# Patient Record
Sex: Female | Born: 1975 | Race: White | Hispanic: No | Marital: Married | Smoking: Never smoker
Health system: Southern US, Community
[De-identification: ages and names within clinical notes are randomized; demographics above are authoritative.]

## PROBLEM LIST (undated history)

## (undated) DIAGNOSIS — K219 Gastro-esophageal reflux disease without esophagitis: Secondary | ICD-10-CM

## (undated) DIAGNOSIS — J45909 Unspecified asthma, uncomplicated: Secondary | ICD-10-CM

## (undated) DIAGNOSIS — T7840XA Allergy, unspecified, initial encounter: Secondary | ICD-10-CM

## (undated) DIAGNOSIS — G43909 Migraine, unspecified, not intractable, without status migrainosus: Secondary | ICD-10-CM

## (undated) HISTORY — DX: Migraine, unspecified, not intractable, without status migrainosus: G43.909

## (undated) HISTORY — DX: Unspecified asthma, uncomplicated: J45.909

## (undated) HISTORY — DX: Gastro-esophageal reflux disease without esophagitis: K21.9

## (undated) HISTORY — DX: Allergy, unspecified, initial encounter: T78.40XA

---

## 2012-03-03 ENCOUNTER — Ambulatory Visit: Payer: Self-pay | Admitting: Internal Medicine

## 2012-03-19 ENCOUNTER — Ambulatory Visit: Payer: Self-pay | Admitting: Internal Medicine

## 2012-04-01 ENCOUNTER — Emergency Department: Payer: Self-pay | Admitting: Unknown Physician Specialty

## 2012-04-01 LAB — COMPREHENSIVE METABOLIC PANEL
Alkaline Phosphatase: 82 U/L (ref 50–136)
Anion Gap: 8 (ref 7–16)
Bilirubin,Total: 0.3 mg/dL (ref 0.2–1.0)
Chloride: 105 mmol/L (ref 98–107)
Co2: 25 mmol/L (ref 21–32)
Creatinine: 1.12 mg/dL (ref 0.60–1.30)
EGFR (African American): >60
EGFR (Non-African Amer.): >60
SGPT (ALT): 44 U/L
Sodium: 138 mmol/L (ref 136–145)

## 2012-04-01 LAB — CBC
HGB: 14.1 g/dL (ref 12.0–16.0)
MCH: 29.9 pg (ref 26.0–34.0)
MCV: 89 fL (ref 80–100)
Platelet: 218 10*3/uL (ref 150–440)
RBC: 4.72 10*6/uL (ref 3.80–5.20)
WBC: 10.4 10*3/uL (ref 3.6–11.0)

## 2012-04-01 LAB — URINALYSIS, COMPLETE
Glucose,UR: NEGATIVE mg/dL (ref 0–75)
Ketone: NEGATIVE
Ph: 8 (ref 4.5–8.0)
Protein: NEGATIVE
Specific Gravity: 1.006 (ref 1.003–1.030)
Squamous Epithelial: 1

## 2012-05-06 DIAGNOSIS — G44209 Tension-type headache, unspecified, not intractable: Secondary | ICD-10-CM | POA: Insufficient documentation

## 2013-02-13 ENCOUNTER — Ambulatory Visit: Payer: Self-pay | Admitting: Internal Medicine

## 2015-01-13 DIAGNOSIS — G43809 Other migraine, not intractable, without status migrainosus: Secondary | ICD-10-CM | POA: Insufficient documentation

## 2015-02-16 ENCOUNTER — Encounter: Payer: Self-pay | Admitting: General Surgery

## 2015-02-16 ENCOUNTER — Ambulatory Visit (INDEPENDENT_AMBULATORY_CARE_PROVIDER_SITE_OTHER): Payer: 59 | Admitting: General Surgery

## 2015-02-16 VITALS — BP 112/78 | HR 78 | Resp 12 | Ht 64.0 in | Wt 146.0 lb

## 2015-02-16 DIAGNOSIS — K8020 Calculus of gallbladder without cholecystitis without obstruction: Secondary | ICD-10-CM | POA: Diagnosis not present

## 2015-02-16 NOTE — Progress Notes (Signed)
Patient ID: Jenna White, female   DOB: 1976-05-19, 39 y.o.   MRN: [id number]  Chief Complaint  Patient presents with  . Other    gall stones    HPI Jenna White is a 39 y.o. female.  Here today for evaluation of galls tones. She states she has had right upper abdominal pain on/off for several years. She states the pain has occurred at night. The pain is random lasting 30 min to 2 hours and goes through to her back. No relationship to foods. Occasional nausea and vomiting. Ultrasound was 02-02-15.  HPI  Past Medical History  Diagnosis Date  . Migraine   . GERD (gastroesophageal reflux disease)     History reviewed. No pertinent past surgical history.  Family History  Problem Relation Age of Onset  . Cancer Mother     colon  . Diabetes Father     Social History History  Substance Use Topics  . Smoking status: Never Smoker   . Smokeless tobacco: Never Used  . Alcohol Use: 0.0 oz/week    0 Standard drinks or equivalent per week     Comment: 1/day    Allergies  Allergen Reactions  . Bacitracin Rash    Current Outpatient Prescriptions  Medication Sig Dispense Refill  . Cholecalciferol (VITAMIN D PO) Take 4,000 mg by mouth daily.    . Lactobacillus (ACIDOPHILUS PO) Take by mouth.    . levonorgestrel (MIRENA) 20 MCG/24HR IUD 1 each by Intrauterine route once.    . loratadine (CLARITIN) 10 MG tablet Take 10 mg by mouth daily.    . meclizine (ANTIVERT) 25 MG tablet Take 25 mg by mouth 3 (three) times daily as needed for dizziness.    . topiramate (TOPAMAX) 200 MG tablet Take 200 mg by mouth daily.     No current facility-administered medications for this visit.    Review of Systems Review of Systems  Constitutional: Negative.   Respiratory: Negative.   Cardiovascular: Negative.   Gastrointestinal: Positive for nausea, vomiting and abdominal pain. Negative for diarrhea and constipation.    Blood pressure 112/78, pulse 78, resp. rate 12, height 5\' 4"  (1.626  m), weight 146 lb (66.225 kg).  Physical Exam Physical Exam  Constitutional: She is oriented to person, place, and time. She appears well-developed and well-nourished.  Eyes: Conjunctivae are normal. No scleral icterus.  Neck: Neck supple.  Cardiovascular: Normal rate, regular rhythm and normal heart sounds.   Pulmonary/Chest: Effort normal and breath sounds normal.  Abdominal: Soft. Normal appearance and bowel sounds are normal. There is no hepatomegaly. There is no tenderness. There is negative Murphy's sign.  Lymphadenopathy:    She has no cervical adenopathy.  Neurological: She is alert and oriented to person, place, and time.  Skin: Skin is warm and dry.    Data Reviewed Office notes and labs. During kidney US gallstones were seen. CBC was nirmal. No LFTS done Assessment        Plan    Liver functions   Laparoscopic Cholecystectomy with Intraoperative Cholangiogram. The procedure, including it's potential risks and complications (including but not limited to infection, bleeding, injury to intra-abdominal organs or bile ducts, bile leak, poor cosmetic result, sepsis and death) were discussed with the patient in detail. Non-operative options, including their inherent risks (acute calculous cholecystitis with possible choledocholithiasis or gallstone pancreatitis, with the risk of ascending cholangitis, sepsis, and death) were discussed as well. The patient expressed and understanding of what we discussed and wishes to proceed with  laparoscopic cholecystectomy. The patient further understands that if it is technically not possible, or it is unsafe to proceed laparoscopically, that I will convert to an open cholecystectomy.     PCP:  None Ref Lavonia Dana    Rylyn Zawistowski G 02/16/2015, 3:38 PM

## 2015-02-16 NOTE — Patient Instructions (Addendum)

## 2015-02-17 LAB — HEPATIC FUNCTION PANEL
ALK PHOS: 50 IU/L (ref 39–117)
ALT: 14 IU/L (ref 0–32)
AST: 12 IU/L (ref 0–40)
Albumin: 4.5 g/dL (ref 3.5–5.5)
Bilirubin Total: <0.2 mg/dL (ref 0.0–1.2)
Bilirubin, Direct: 0.08 mg/dL (ref 0.00–0.40)
TOTAL PROTEIN: 7.1 g/dL (ref 6.0–8.5)

## 2015-02-21 ENCOUNTER — Inpatient Hospital Stay
Admission: RE | Admit: 2015-02-21 | Discharge: 2015-02-21 | Disposition: A | Discharge status: Home / Self Care | Payer: Self-pay | Source: Ambulatory Visit

## 2015-02-21 HISTORY — DX: Migraine, unspecified, not intractable, without status migrainosus: G43.909

## 2015-02-21 NOTE — Patient Instructions (Addendum)
  Your procedure is scheduled on: 02/25/15 Friday  Report to Day Surgery. To find out your arrival time please call [phone] between 1PM - 3PM on 02/24/15.  Remember: Instructions that are not followed completely may result in serious medical risk, up to and including death, or upon the discretion of your surgeon and anesthesiologist your surgery may need to be rescheduled.    __x__ 1. Do not eat food or drink liquids after midnight. No gum chewing or hard candies.     ___x_ 2. No Alcohol for 24 hours before or after surgery.   ____ 3. Bring all medications with you on the day of surgery if instructed.    __x__ 4. Notify your doctor if there is any change in your medical condition     (cold, fever, infections).     Do not wear jewelry, make-up, hairpins, clips or nail polish.  Do not wear lotions, powders, or perfumes. You may wear deodorant.  Do not shave 48 hours prior to surgery. Men may shave face and neck.  Do not bring valuables to the hospital.    [facility] is not responsible for any belongings or valuables.               Contacts, dentures or bridgework may not be worn into surgery.  Leave your suitcase in the car. After surgery it may be brought to your room.  For patients admitted to the hospital, discharge time is determined by your                treatment team.   Patients discharged the day of surgery will not be allowed to drive home.   Please read over the following fact sheets that you were given:      _x___ Take these medicines the morning of surgery with A SIP OF WATER:    1. Antivert as needed  2. Topamax as needed  3.   4.  5.  6.  ____ Fleet Enema (as directed)   ____ Use CHG Soap as directed  ____ Use inhalers on the day of surgery  ____ Stop metformin 2 days prior to surgery    ____ Take 1/2 of usual insulin dose the night before surgery and none on the morning of surgery.   ____ Stop Coumadin/Plavix/aspirin on  __x__ Stop  Anti-inflammatories on 02/21/15   ____ Stop supplements until after surgery.    ____ Bring C-Pap to the hospital.

## 2015-02-24 NOTE — Progress Notes (Signed)
This encounter was created in error - please disregard.

## 2015-02-25 ENCOUNTER — Ambulatory Visit
Admission: RE | Admit: 2015-02-25 | Discharge: 2015-02-25 | Disposition: A | Discharge status: Home / Self Care | Payer: 59 | Source: Ambulatory Visit | Attending: General Surgery | Admitting: General Surgery

## 2015-02-25 ENCOUNTER — Encounter
Admission: RE | Disposition: A | Discharge status: Home / Self Care | Payer: Self-pay | Source: Ambulatory Visit | Attending: General Surgery

## 2015-02-25 ENCOUNTER — Ambulatory Visit: Payer: 59 | Admitting: Certified Registered Nurse Anesthetist

## 2015-02-25 ENCOUNTER — Encounter: Payer: Self-pay | Admitting: *Deleted

## 2015-02-25 ENCOUNTER — Ambulatory Visit: Payer: 59

## 2015-02-25 DIAGNOSIS — K8020 Calculus of gallbladder without cholecystitis without obstruction: Secondary | ICD-10-CM

## 2015-02-25 DIAGNOSIS — K8010 Calculus of gallbladder with chronic cholecystitis without obstruction: Secondary | ICD-10-CM | POA: Insufficient documentation

## 2015-02-25 DIAGNOSIS — G43909 Migraine, unspecified, not intractable, without status migrainosus: Secondary | ICD-10-CM | POA: Diagnosis not present

## 2015-02-25 DIAGNOSIS — K819 Cholecystitis, unspecified: Secondary | ICD-10-CM

## 2015-02-25 DIAGNOSIS — Z79899 Other long term (current) drug therapy: Secondary | ICD-10-CM | POA: Diagnosis not present

## 2015-02-25 DIAGNOSIS — K219 Gastro-esophageal reflux disease without esophagitis: Secondary | ICD-10-CM | POA: Insufficient documentation

## 2015-02-25 HISTORY — PX: CHOLECYSTECTOMY: SHX55

## 2015-02-25 LAB — POCT PREGNANCY, URINE: Preg Test, Ur: NEGATIVE

## 2015-02-25 SURGERY — LAPAROSCOPIC CHOLECYSTECTOMY WITH INTRAOPERATIVE CHOLANGIOGRAM
Anesthesia: General

## 2015-02-25 MED ORDER — LIDOCAINE HCL (CARDIAC) 20 MG/ML IV SOLN
INTRAVENOUS | Status: DC | PRN
  Administered 2015-02-25: 50 mg via INTRAVENOUS

## 2015-02-25 MED ORDER — DEXAMETHASONE SODIUM PHOSPHATE 10 MG/ML IJ SOLN
INTRAMUSCULAR | Status: AC
  Filled 2015-02-25: qty 1

## 2015-02-25 MED ORDER — NEOSTIGMINE METHYLSULFATE 10 MG/10ML IV SOLN
INTRAVENOUS | Status: DC | PRN
  Administered 2015-02-25: 3 mg via INTRAVENOUS

## 2015-02-25 MED ORDER — FENTANYL CITRATE (PF) 100 MCG/2ML IJ SOLN
INTRAMUSCULAR | Status: DC | PRN
  Administered 2015-02-25 (×2): 50 ug via INTRAVENOUS
  Administered 2015-02-25: 100 ug via INTRAVENOUS
  Administered 2015-02-25: 50 ug via INTRAVENOUS

## 2015-02-25 MED ORDER — PROPOFOL 10 MG/ML IV BOLUS
INTRAVENOUS | Status: DC | PRN
  Administered 2015-02-25: 200 mg via INTRAVENOUS
  Administered 2015-02-25: 20 mg via INTRAVENOUS

## 2015-02-25 MED ORDER — FAMOTIDINE 20 MG PO TABS
ORAL_TABLET | ORAL | Status: AC
  Administered 2015-02-25: 08:00:00
  Filled 2015-02-25: qty 1

## 2015-02-25 MED ORDER — GLYCOPYRROLATE 0.2 MG/ML IJ SOLN
INTRAMUSCULAR | Status: DC | PRN
  Administered 2015-02-25: 0.6 mg via INTRAVENOUS

## 2015-02-25 MED ORDER — ACETAMINOPHEN 10 MG/ML IV SOLN
INTRAVENOUS | Status: AC
  Filled 2015-02-25: qty 100

## 2015-02-25 MED ORDER — SODIUM CHLORIDE 0.9 % IV SOLN
INTRAVENOUS | Status: DC | PRN
  Administered 2015-02-25: 20 mL

## 2015-02-25 MED ORDER — SODIUM CHLORIDE 0.9 % IJ SOLN
INTRAMUSCULAR | Status: AC
  Filled 2015-02-25: qty 50

## 2015-02-25 MED ORDER — LACTATED RINGERS IV SOLN
INTRAVENOUS | Status: DC
  Administered 2015-02-25 (×2): via INTRAVENOUS

## 2015-02-25 MED ORDER — CEFAZOLIN SODIUM-DEXTROSE 2-3 GM-% IV SOLR
2.0000 g | INTRAVENOUS | Status: AC
  Administered 2015-02-25: 2 g via INTRAVENOUS

## 2015-02-25 MED ORDER — HYDROMORPHONE HCL 1 MG/ML IJ SOLN
INTRAMUSCULAR | Status: AC
  Administered 2015-02-25: 0.5 mg via INTRAVENOUS
  Filled 2015-02-25: qty 1

## 2015-02-25 MED ORDER — ROCURONIUM BROMIDE 100 MG/10ML IV SOLN
INTRAVENOUS | Status: DC | PRN
  Administered 2015-02-25: 40 mg via INTRAVENOUS

## 2015-02-25 MED ORDER — DEXAMETHASONE SODIUM PHOSPHATE 10 MG/ML IJ SOLN
8.0000 mg | Freq: Once | INTRAMUSCULAR | Status: AC | PRN
  Administered 2015-02-25: 8 mg via INTRAVENOUS

## 2015-02-25 MED ORDER — CEFAZOLIN SODIUM-DEXTROSE 2-3 GM-% IV SOLR
INTRAVENOUS | Status: AC
  Administered 2015-02-25: 2 g via INTRAVENOUS
  Filled 2015-02-25: qty 50

## 2015-02-25 MED ORDER — MIDAZOLAM HCL 5 MG/5ML IJ SOLN
INTRAMUSCULAR | Status: DC | PRN
  Administered 2015-02-25 (×2): 2 mg via INTRAVENOUS

## 2015-02-25 MED ORDER — ACETAMINOPHEN 10 MG/ML IV SOLN
INTRAVENOUS | Status: DC | PRN
  Administered 2015-02-25: 1000 mg via INTRAVENOUS

## 2015-02-25 MED ORDER — HYDROMORPHONE HCL 1 MG/ML IJ SOLN
0.2500 mg | INTRAMUSCULAR | Status: DC | PRN
  Administered 2015-02-25 (×3): 0.5 mg via INTRAVENOUS

## 2015-02-25 MED ORDER — LACTATED RINGERS IR SOLN
Status: DC | PRN
  Administered 2015-02-25: 400 mL

## 2015-02-25 MED ORDER — DEXAMETHASONE SODIUM PHOSPHATE 4 MG/ML IJ SOLN
INTRAMUSCULAR | Status: DC | PRN
  Administered 2015-02-25: 8 mg via INTRAVENOUS

## 2015-02-25 MED ORDER — OXYCODONE-ACETAMINOPHEN 5-325 MG PO TABS: 1.0000 | ORAL_TABLET | ORAL | Status: DC | PRN

## 2015-02-25 MED ORDER — FAMOTIDINE 20 MG PO TABS: 20.0000 mg | ORAL_TABLET | Freq: Once | ORAL | Status: DC

## 2015-02-25 MED ORDER — ONDANSETRON HCL 4 MG/2ML IJ SOLN
INTRAMUSCULAR | Status: DC | PRN
  Administered 2015-02-25: 4 mg via INTRAVENOUS

## 2015-02-25 SURGICAL SUPPLY — 36 items
ANCHOR TIS RET SYS 235ML (MISCELLANEOUS) ×2 IMPLANT
APPLICATOR SURGIFLO (MISCELLANEOUS) IMPLANT
APPLIER CLIP LOGIC TI 5 (MISCELLANEOUS) ×2 IMPLANT
BLADE SURG 11 STRL SS SAFETY (MISCELLANEOUS) ×2 IMPLANT
CANISTER SUCT 1200ML W/VALVE (MISCELLANEOUS) ×2 IMPLANT
CANNULA DILATOR 10 W/SLV (CANNULA) ×2 IMPLANT
CATH CHOLANG 76X19 KUMAR (CATHETERS) ×2 IMPLANT
CHLORAPREP W/TINT 26ML (MISCELLANEOUS) ×2 IMPLANT
DECANTER SPIKE VIAL GLASS SM (MISCELLANEOUS) ×4 IMPLANT
DEFOGGER SCOPE WARMER CLEARIFY (MISCELLANEOUS) ×2 IMPLANT
DRAPE C-ARM XRAY 36X54 (DRAPES) ×2 IMPLANT
DRAPE INCISE IOBAN 66X45 STRL (DRAPES) ×2 IMPLANT
DRESSING TELFA 4X3 1S ST N-ADH (GAUZE/BANDAGES/DRESSINGS) ×2 IMPLANT
GLOVE BIO SURGEON STRL SZ7 (GLOVE) ×10 IMPLANT
GOWN STRL REUS W/ TWL LRG LVL3 (GOWN DISPOSABLE) ×3 IMPLANT
GOWN STRL REUS W/TWL LRG LVL3 (GOWN DISPOSABLE) ×3
GRASPER SUT TROCAR 14GX15 (MISCELLANEOUS) ×2 IMPLANT
HEMOSTAT SURGICEL 2X3 (HEMOSTASIS) ×2 IMPLANT
IRRIGATION STRYKERFLOW (MISCELLANEOUS) ×1 IMPLANT
IRRIGATOR STRYKERFLOW (MISCELLANEOUS) ×2
IV LACTATED RINGERS 1000ML (IV SOLUTION) ×2 IMPLANT
KIT RM TURNOVER STRD PROC AR (KITS) ×2 IMPLANT
LABEL OR SOLS (LABEL) ×2 IMPLANT
NDL INSUFF ACCESS 14 VERSASTEP (NEEDLE) ×2 IMPLANT
PACK LAP CHOLECYSTECTOMY (MISCELLANEOUS) ×2 IMPLANT
PAD GROUND ADULT SPLIT (MISCELLANEOUS) ×2 IMPLANT
SCISSORS METZENBAUM CVD 33 (INSTRUMENTS) ×2 IMPLANT
SLEEVE ENDOPATH XCEL 5M (ENDOMECHANICALS) ×4 IMPLANT
SPOGE SURGIFLO 8M (HEMOSTASIS)
SPONGE SURGIFLO 8M (HEMOSTASIS) IMPLANT
STRIP CLOSURE SKIN 1/2X4 (GAUZE/BANDAGES/DRESSINGS) ×2 IMPLANT
SUT VIC AB 0 SH 27 (SUTURE) ×2 IMPLANT
SUT VIC AB 4-0 FS2 27 (SUTURE) ×2 IMPLANT
SWABSTK COMLB BENZOIN TINCTURE (MISCELLANEOUS) ×2 IMPLANT
TROCAR XCEL NON-BLD 5MMX100MML (ENDOMECHANICALS) ×2 IMPLANT
TUBING INSUFFLATOR HI FLOW (MISCELLANEOUS) ×2 IMPLANT

## 2015-02-25 NOTE — H&P (View-Only) (Signed)
Patient ID: Jenna White, female   DOB: August 22, 1976, 39 y.o.   MRN: [id number]  Chief Complaint  Patient presents with  . Other    gall stones    HPI Jenna White is a 39 y.o. female.  Here today for evaluation of galls tones. She states she has had right upper abdominal pain on/off for several years. She states the pain has occurred at night. The pain is random lasting 30 min to 2 hours and goes through to her back. No relationship to foods. Occasional nausea and vomiting. Ultrasound was 02-02-15.  HPI  Past Medical History  Diagnosis Date  . Migraine   . GERD (gastroesophageal reflux disease)     History reviewed. No pertinent past surgical history.  Family History  Problem Relation Age of Onset  . Cancer Mother     colon  . Diabetes Father     Social History History  Substance Use Topics  . Smoking status: Never Smoker   . Smokeless tobacco: Never Used  . Alcohol Use: 0.0 oz/week    0 Standard drinks or equivalent per week     Comment: 1/day    Allergies  Allergen Reactions  . Bacitracin Rash    Current Outpatient Prescriptions  Medication Sig Dispense Refill  . Cholecalciferol (VITAMIN D PO) Take 4,000 mg by mouth daily.    . Lactobacillus (ACIDOPHILUS PO) Take by mouth.    . levonorgestrel (MIRENA) 20 MCG/24HR IUD 1 each by Intrauterine route once.    . loratadine (CLARITIN) 10 MG tablet Take 10 mg by mouth daily.    . meclizine (ANTIVERT) 25 MG tablet Take 25 mg by mouth 3 (three) times daily as needed for dizziness.    . topiramate (TOPAMAX) 200 MG tablet Take 200 mg by mouth daily.     No current facility-administered medications for this visit.    Review of Systems Review of Systems  Constitutional: Negative.   Respiratory: Negative.   Cardiovascular: Negative.   Gastrointestinal: Positive for nausea, vomiting and abdominal pain. Negative for diarrhea and constipation.    Blood pressure 112/78, pulse 78, resp. rate 12, height 5\' 4"  (1.626  m), weight 146 lb (66.225 kg).  Physical Exam Physical Exam  Constitutional: She is oriented to person, place, and time. She appears well-developed and well-nourished.  Eyes: Conjunctivae are normal. No scleral icterus.  Neck: Neck supple.  Cardiovascular: Normal rate, regular rhythm and normal heart sounds.   Pulmonary/Chest: Effort normal and breath sounds normal.  Abdominal: Soft. Normal appearance and bowel sounds are normal. There is no hepatomegaly. There is no tenderness. There is negative Murphy's sign.  Lymphadenopathy:    She has no cervical adenopathy.  Neurological: She is alert and oriented to person, place, and time.  Skin: Skin is warm and dry.    Data Reviewed Office notes and labs. During kidney US gallstones were seen. CBC was nirmal. No LFTS done Assessment        Plan    Liver functions   Laparoscopic Cholecystectomy with Intraoperative Cholangiogram. The procedure, including it's potential risks and complications (including but not limited to infection, bleeding, injury to intra-abdominal organs or bile ducts, bile leak, poor cosmetic result, sepsis and death) were discussed with the patient in detail. Non-operative options, including their inherent risks (acute calculous cholecystitis with possible choledocholithiasis or gallstone pancreatitis, with the risk of ascending cholangitis, sepsis, and death) were discussed as well. The patient expressed and understanding of what we discussed and wishes to proceed with  laparoscopic cholecystectomy. The patient further understands that if it is technically not possible, or it is unsafe to proceed laparoscopically, that I will convert to an open cholecystectomy.     PCP:  None Ref Jenna White    Jenna White 02/16/2015, 3:38 PM

## 2015-02-25 NOTE — Anesthesia Procedure Notes (Signed)
Procedure Name: Intubation Date/Time: 02/25/2015 9:23 AM Performed by: Christy Sartorius Pre-anesthesia Checklist: Patient identified, Emergency Drugs available, Suction available, Patient being monitored and Timeout performed Patient Re-evaluated:Patient Re-evaluated prior to inductionOxygen Delivery Method: Circle system utilized Preoxygenation: Pre-oxygenation with 100% oxygen Intubation Type: IV induction Ventilation: Mask ventilation without difficulty Laryngoscope Size: Mac and 3 Grade View: Grade I Tube type: Oral Tube size: 7.0 mm Number of attempts: 1 Airway Equipment and Method: Stylet Placement Confirmation: ETT inserted through vocal cords under direct vision,  positive ETCO2 and breath sounds checked- equal and bilateral Secured at: 19 cm Tube secured with: Tape Dental Injury: Teeth and Oropharynx as per pre-operative assessment  Comments: Gauze bite block in place

## 2015-02-25 NOTE — Anesthesia Preprocedure Evaluation (Signed)
Anesthesia Evaluation  Patient identified by MRN, date of birth, ID band Patient awake    Reviewed: Allergy & Precautions, NPO status , Patient's Chart, lab work & pertinent test results  Airway Mallampati: I  TM Distance: >3 FB Neck ROM: Full    Dental  (+) Teeth Intact   Pulmonary  breath sounds clear to auscultation  Pulmonary exam normal       Cardiovascular Exercise Tolerance: Good Normal cardiovascular examRhythm:Regular Rate:Normal     Neuro/Psych  Headaches,    GI/Hepatic   Endo/Other    Renal/GU      Musculoskeletal   Abdominal (+)  Abdomen: tender.    Peds  Hematology   Anesthesia Other Findings   Reproductive/Obstetrics                             Anesthesia Physical Anesthesia Plan  ASA: II  Anesthesia Plan: General   Post-op Pain Management:    Induction: Intravenous  Airway Management Planned: Oral ETT  Additional Equipment:   Intra-op Plan:   Post-operative Plan: Extubation in OR  Informed Consent: I have reviewed the patients History and Physical, chart, labs and discussed the procedure including the risks, benefits and alternatives for the proposed anesthesia with the patient or authorized representative who has indicated his/her understanding and acceptance.     Plan Discussed with: CRNA  Anesthesia Plan Comments:         Anesthesia Quick Evaluation

## 2015-02-25 NOTE — Op Note (Signed)
Preop diagnosis: Chronic cholecystitis and cholelithiasis  Post op diagnosis: Same  Operation: Laparoscopy cholecystectomy and intraoperative cholangiogram  Anesthesia: Gen.  Surgeon: Mckinley Jewel MD  Complications: None  EBL: Less than 25 mL  Drains: None  Description: Patient was brought to the operating room and put to sleep in the supine position the operating table. Timeout was performed abdomen was prepped and draped sterile field. Port site incision was made at the umbilicus and Veress needle position the peritoneal cavity verified of the hanging drop method. 11 mm port was then placed after pneumoperitoneum was obtained. With the camera and the peritoneal cavity epigastric and 2 lateral 5 mm ports were placed. Gallbladder was started containing a phrygian cap with some adhesions which were easily taken down. The duodenum was tented up with an adhesion and this was carefully freed. With satisfactory traction the cystic duct was first isolated and freed. Kumar clamp and catheter were positioned and cholangiogram was performed. There was good filling of the common bile duct and minimal filling of the proximal hepatic duct no evidence of stone or obstruction to flow catheter was used to decompress the gallbladder and removed. The cystic duct was hemoclipped and cut. The cystic artery was identified which is fairly sizable in area was freed all the way up towards the gallbladder and then hemoclipped and cut. Gallbladder was then freed from the gallbladder bed using cautery for control of bleeding and noted that there was significant adhesion of the gallbladder to the liver parenchyma near the fundus of the gallbladder. Portion of the dissection was performed O with gentle traction and use of cautery to minimize bleeding. The gallbladder bed was inspected and irrigated with some fluid which was then subsequently suctioned out both from underneath the liver and around the side of the liver. With a  5 mm scope in the epigastric port site the gallbladder was placed in the in a retrieval bag and brought out through the umbilical port site it was noted contain a single large 2 cm stone. The fascial opening of the umbilicus was then closed with 0 Vicryls placed with a suture passer.. All the skin incisions were then closed with subcuticular 4-0 Monocryl after remaining ports are removed. Steri-Strips and tincture benzoin Telfa and Tegaderm dressings placed. No immediate problems encountered from the procedure. Patient was extubated and returned recovery room in stable condition.

## 2015-02-25 NOTE — Transfer of Care (Signed)
Immediate Anesthesia Transfer of Care Note  Patient: Jenna White  Procedure(s) Performed: Procedure(s): LAPAROSCOPIC CHOLECYSTECTOMY WITH INTRAOPERATIVE CHOLANGIOGRAM (N/A)  Patient Location: PACU  Anesthesia Type:General  Level of Consciousness: awake  Airway & Oxygen Therapy: Patient Spontanous Breathing and Patient connected to face mask oxygen  Post-op Assessment: Report given to RN  Post vital signs: Reviewed and stable  Last Vitals:  Filed Vitals:   02/25/15 0741  BP: 106/82  Pulse: 91  Temp: 37.1 C  Resp: 16    Complications: No apparent anesthesia complications

## 2015-02-25 NOTE — Progress Notes (Signed)
preop report given to Tawny Asal, RN by Phillips Grout, RN

## 2015-02-25 NOTE — Discharge Instructions (Signed)
General Anesthesia °General anesthesia is a sleep-like state of non-feeling produced by medicines (anesthetics). General anesthesia prevents you from being alert and feeling pain during a medical procedure. Your caregiver may recommend general anesthesia if your procedure: °· Is long. °· Is painful or uncomfortable. °· Would be frightening to see or hear. °· Requires you to be still. °· Affects your breathing. °· Causes significant blood loss. °LET YOUR CAREGIVER KNOW ABOUT: °· Allergies to food or medicine. °· Medicines taken, including vitamins, herbs, eyedrops, over-the-counter medicines, and creams. °· Use of steroids (by mouth or creams). °· Previous problems with anesthetics or numbing medicines, including problems experienced by relatives. °· History of bleeding problems or blood clots. °· Previous surgeries and types of anesthetics received. °· Possibility of pregnancy, if this applies. °· Use of cigarettes, alcohol, or illegal drugs. °· Any health condition(s), especially diabetes, sleep apnea, and high blood pressure. °RISKS AND COMPLICATIONS °General anesthesia rarely causes complications. However, if complications do occur, they can be life threatening. Complications include: °· A lung infection. °· A stroke. °· A heart attack. °· Waking up during the procedure. When this occurs, the patient may be unable to move and communicate that he or she is awake. The patient may feel severe pain. °Older adults and adults with serious medical problems are more likely to have complications than adults who are young and healthy. Some complications can be prevented by answering all of your caregiver's questions thoroughly and by following all pre-procedure instructions. It is important to tell your caregiver if any of the pre-procedure instructions, especially those related to diet, were not followed. Any food or liquid in the stomach can cause problems when you are under general anesthesia. °BEFORE THE  PROCEDURE °· Ask your caregiver if you will have to spend the night at the hospital. If you will not have to spend the night, arrange to have an adult drive you and stay with you for 24 hours. °· Follow your caregiver's instructions if you are taking dietary supplements or medicines. Your caregiver may tell you to stop taking them or to reduce your dosage. °· Do not smoke for as long as possible before your procedure. If possible, stop smoking 3-6 weeks before the procedure. °· Do not take new dietary supplements or medicines within 1 week of your procedure unless your caregiver approves them. °· Do not eat within 8 hours of your procedure or as directed by your caregiver. Drink only clear liquids, such as water, black coffee (without milk or cream), and fruit juices (without pulp). °· Do not drink within 3 hours of your procedure or as directed by your caregiver. °· You may brush your teeth on the morning of the procedure, but make sure to spit out the toothpaste and water when finished. °PROCEDURE  °You will receive anesthetics through a mask, through an intravenous (IV) access tube, or through both. A doctor who specializes in anesthesia (anesthesiologist) or a nurse who specializes in anesthesia (nurse anesthetist) or both will stay with you throughout the procedure to make sure you remain unconscious. He or she will also watch your blood pressure, pulse, and oxygen levels to make sure that the anesthetics do not cause any problems. Once you are asleep, a breathing tube or mask may be used to help you breathe. °AFTER THE PROCEDURE °You will wake up after the procedure is complete. You may be in the room where the procedure was performed or in a recovery area. You may have a sore throat if   a breathing tube was used. You may also feel: °· Dizzy. °· Weak. °· Drowsy. °· Confused. °· Nauseous. °· Cold. °These are all normal responses and can be expected to last for up to 24 hours after the procedure is complete. A  caregiver will tell you when you are ready to go home. This will usually be when you are fully awake and in stable condition. °Document Released: 12/25/2007 Document Revised: 02/01/2014 Document Reviewed: 01/16/2012 °[employer]® Patient Information ©2015 [employer]. This information is not intended to replace advice given to you by your health care provider. Make sure you discuss any questions you have with your health care provider. ° °

## 2015-02-25 NOTE — Anesthesia Postprocedure Evaluation (Signed)
  Anesthesia Post-op Note  Patient: Jenna White  Procedure(s) Performed: Procedure(s): LAPAROSCOPIC CHOLECYSTECTOMY WITH INTRAOPERATIVE CHOLANGIOGRAM (N/A)  Anesthesia type:General  Patient location: PACU  Post pain: Pain level controlled  Post assessment: Post-op Vital signs reviewed, Patient's Cardiovascular Status Stable, Respiratory Function Stable, Patent Airway and No signs of Nausea or vomiting  Post vital signs: Reviewed and stable  Last Vitals:  Filed Vitals:   02/25/15 1221  BP:   Pulse:   Temp: 36.3 C  Resp:     Level of consciousness: awake, alert  and patient cooperative  Complications: No apparent anesthesia complications

## 2015-02-25 NOTE — Interval H&P Note (Signed)
History and Physical Interval Note:  02/25/2015 8:57 AM  Jenna White  has presented today for surgery, with the diagnosis of CHOLECYSTITIS  The various methods of treatment have been discussed with the patient and family. After consideration of risks, benefits and other options for treatment, the patient has consented to  Procedure(s): LAPAROSCOPIC CHOLECYSTECTOMY WITH INTRAOPERATIVE CHOLANGIOGRAM (N/A) as a surgical intervention .  The patient's history has been reviewed, patient examined, no change in status, stable for surgery.  I have reviewed the patient's chart and labs.  Questions were answered to the patient's satisfaction.     SANKAR,SEEPLAPUTHUR G

## 2015-03-01 LAB — SURGICAL PATHOLOGY

## 2015-03-08 ENCOUNTER — Ambulatory Visit (INDEPENDENT_AMBULATORY_CARE_PROVIDER_SITE_OTHER): Payer: 59 | Admitting: General Surgery

## 2015-03-08 ENCOUNTER — Encounter: Payer: Self-pay | Admitting: General Surgery

## 2015-03-08 VITALS — BP 122/82 | HR 66 | Resp 12 | Ht 64.0 in | Wt 141.0 lb

## 2015-03-08 DIAGNOSIS — K8020 Calculus of gallbladder without cholecystitis without obstruction: Secondary | ICD-10-CM

## 2015-03-08 NOTE — Progress Notes (Signed)
Patient here for post op cholecystectomy done on 02/25/15. Patient reports minimal pain following surgery. She does state she has had itching only in the upper body which started after surgery.   Not having any pain. Eating well. Having some digestive discomfort.   Abdomen soft, nontender. Port sites are clean and healing well. Lungs are clear to auscultation and percussion bilaterally.  Can return to normal activity and diet as tolerated. If diarrhea becomes an issue, call the office.   Use benadryl for the itching. Also check LFTs.   Follow up prn.

## 2015-03-09 ENCOUNTER — Encounter: Payer: Self-pay | Admitting: General Surgery

## 2015-03-10 ENCOUNTER — Telehealth: Payer: Self-pay | Admitting: *Deleted

## 2015-03-10 LAB — HEPATIC FUNCTION PANEL
ALT: 12 IU/L (ref 0–32)
AST: 9 IU/L (ref 0–40)
Albumin: 4.2 g/dL (ref 3.5–5.5)
Alkaline Phosphatase: 55 IU/L (ref 39–117)
Bilirubin Total: 0.4 mg/dL (ref 0.0–1.2)
Bilirubin, Direct: 0.1 mg/dL (ref 0.00–0.40)
TOTAL PROTEIN: 6.6 g/dL (ref 6.0–8.5)

## 2015-03-10 NOTE — Telephone Encounter (Signed)
Notified patient as instructed, patient pleased °

## 2015-03-10 NOTE — Progress Notes (Signed)
Quick Note:  Inform pt labs are normal. F/u as scheduled ______ 

## 2015-03-10 NOTE — Telephone Encounter (Signed)
-----   Message from Christene Lye, MD sent at 03/10/2015  6:36 AM EDT ----- Inform pt labs are normal. F/u as scheduled

## 2015-04-06 ENCOUNTER — Ambulatory Visit: Payer: 59 | Admitting: General Surgery

## 2015-05-25 ENCOUNTER — Ambulatory Visit (INDEPENDENT_AMBULATORY_CARE_PROVIDER_SITE_OTHER): Payer: 59 | Admitting: Family Medicine

## 2015-05-25 ENCOUNTER — Encounter: Payer: Self-pay | Admitting: Family Medicine

## 2015-05-25 VITALS — BP 120/70 | HR 86 | Temp 98.3°F | Resp 18 | Ht 64.0 in | Wt 148.2 lb

## 2015-05-25 DIAGNOSIS — R0982 Postnasal drip: Secondary | ICD-10-CM

## 2015-05-25 DIAGNOSIS — G43909 Migraine, unspecified, not intractable, without status migrainosus: Secondary | ICD-10-CM | POA: Insufficient documentation

## 2015-05-25 DIAGNOSIS — R7989 Other specified abnormal findings of blood chemistry: Secondary | ICD-10-CM | POA: Insufficient documentation

## 2015-05-25 DIAGNOSIS — J329 Chronic sinusitis, unspecified: Secondary | ICD-10-CM | POA: Diagnosis not present

## 2015-05-25 DIAGNOSIS — R946 Abnormal results of thyroid function studies: Secondary | ICD-10-CM

## 2015-05-25 MED ORDER — FLUTICASONE PROPIONATE 50 MCG/ACT NA SUSP: 2.0000 | Freq: Every day | NASAL | Status: DC

## 2015-05-25 MED ORDER — AZITHROMYCIN 250 MG PO TABS: 250.0000 mg | ORAL_TABLET | Freq: Every day | ORAL | Status: DC

## 2015-05-25 NOTE — Progress Notes (Signed)
Name: Jenna White   MRN: [id number]    DOB: May 13, 1976   Date:05/25/2015       Progress Note  Subjective  Chief Complaint  Chief Complaint  Patient presents with  . Acute Visit    NP (Dr. Miles Costain) Possible ear infection x4 days    Otalgia  There is pain in the left ear. This is a new problem. The current episode started in the past 7 days. The problem occurs every few hours. There has been no fever. The pain is at a severity of 8/10. The pain is severe. Pertinent negatives include no coughing, ear discharge, neck pain or sore throat. She has tried NSAIDs for the symptoms. The treatment provided no relief. There is no history of a chronic ear infection.   Elevated TSH Last TSH in March 2016 was elevated to 4.540. Pt. Has fatigue, but no depressed mood, constipation, or dry skin. No history of thyroid disorder.   Past Medical History  Diagnosis Date  . Migraine   . GERD (gastroesophageal reflux disease)   . Migraines   . Allergy     Past Surgical History  Procedure Laterality Date  . Cholecystectomy N/A 02/25/2015    Procedure: LAPAROSCOPIC CHOLECYSTECTOMY WITH INTRAOPERATIVE CHOLANGIOGRAM;  Surgeon: Christene Lye, MD;  Location: [facility] ORS;  Service: General;  Laterality: N/A;    Family History  Problem Relation Age of Onset  . Cancer Mother     colon  . Diabetes Father     Social History   Social History  . Marital Status: Married    Spouse Name: N/A  . Number of Children: N/A  . Years of Education: N/A   Occupational History  . Not on file.   Social History Main Topics  . Smoking status: Never Smoker   . Smokeless tobacco: Never Used  . Alcohol Use: 0.0 oz/week    0 Standard drinks or equivalent per week     Comment: 1/day  . Drug Use: No  . Sexual Activity: Not on file   Other Topics Concern  . Not on file   Social History Narrative     Current outpatient prescriptions:  .  Cetirizine HCl 10 MG CAPS, Take by mouth., Disp: , Rfl:  .   Cholecalciferol (VITAMIN D PO), Take 4,000 mg by mouth daily., Disp: , Rfl:  .  ibuprofen (ADVIL,MOTRIN) 800 MG tablet, Take 800 mg by mouth every 8 (eight) hours as needed., Disp: , Rfl:  .  Lactobacillus (ACIDOPHILUS PO), Take by mouth., Disp: , Rfl:  .  levonorgestrel (MIRENA) 20 MCG/24HR IUD, 1 each by Intrauterine route once., Disp: , Rfl:  .  meclizine (ANTIVERT) 25 MG tablet, Take 25 mg by mouth 3 (three) times daily as needed for dizziness., Disp: , Rfl:  .  topiramate (TOPAMAX) 200 MG tablet, Take 200 mg by mouth daily., Disp: , Rfl:   Allergies  Allergen Reactions  . Mupirocin Calcium   . Bacitracin Rash     Review of Systems  Constitutional: Positive for malaise/fatigue. Negative for fever and chills.  HENT: Positive for ear pain. Negative for ear discharge and sore throat.   Respiratory: Negative for cough.   Gastrointestinal: Negative for constipation.  Musculoskeletal: Negative for neck pain.  Psychiatric/Behavioral: Negative for depression.    Objective  Filed Vitals:   05/25/15 1406  BP: 120/70  Pulse: 86  Temp: 98.3 F (36.8 C)  TempSrc: Oral  Resp: 18  Height: 5\' 4"  (1.626 m)  Weight: 148 lb  3.2 oz (67.223 kg)  SpO2: 97%    Physical Exam  Constitutional: She is oriented to person, place, and time and well-developed, well-nourished, and in no distress. Vital signs are normal.  HENT:  Right Ear: Hearing, tympanic membrane, external ear and ear canal normal.  Left Ear: Hearing, tympanic membrane, external ear and ear canal normal. No drainage, swelling or tenderness.  No middle ear effusion.  Nose: Mucosal edema present. No sinus tenderness. Right sinus exhibits no maxillary sinus tenderness and no frontal sinus tenderness. Left sinus exhibits no maxillary sinus tenderness and no frontal sinus tenderness.  Mouth/Throat: Posterior oropharyngeal erythema (mucus at posterior oropharynx.) present.  Neck: No thyroid mass and no thyromegaly present.   Cardiovascular: Normal rate and regular rhythm.   Pulmonary/Chest: Effort normal and breath sounds normal. No respiratory distress.  Musculoskeletal:       Right ankle: She exhibits no swelling.       Left ankle: She exhibits no swelling.  Neurological: She is alert and oriented to person, place, and time.  Skin: Skin is warm, dry and intact.  Nursing note and vitals reviewed.   Assessment & Plan  1. Elevated TSH  - T4, free - TSH  2. Post-nasal drainage  - fluticasone (FLONASE) 50 MCG/ACT nasal spray; Place 2 sprays into both nostrils daily.  Dispense: 16 g; Refill: 0 - azithromycin (ZITHROMAX Z-PAK) 250 MG tablet; Take 1 tablet (250 mg total) by mouth daily. 2 tabs po x day 1, then 1 tab po q day x 4 days.  Dispense: 6 each; Refill: 0   Aloise Copus [facility] 05/25/2015 2:48 PM

## 2015-05-26 LAB — TSH: TSH: 4.55 u[IU]/mL — AB (ref 0.450–4.500)

## 2015-05-26 LAB — T4, FREE: Free T4: 1.06 ng/dL (ref 0.82–1.77)

## 2015-09-13 ENCOUNTER — Ambulatory Visit (INDEPENDENT_AMBULATORY_CARE_PROVIDER_SITE_OTHER): Payer: 59 | Admitting: Family Medicine

## 2015-09-13 ENCOUNTER — Encounter: Payer: Self-pay | Admitting: Family Medicine

## 2015-09-13 VITALS — BP 120/72 | HR 92 | Temp 98.3°F | Resp 14 | Ht 64.0 in | Wt 152.0 lb

## 2015-09-13 DIAGNOSIS — J0110 Acute frontal sinusitis, unspecified: Secondary | ICD-10-CM

## 2015-09-13 DIAGNOSIS — J019 Acute sinusitis, unspecified: Secondary | ICD-10-CM | POA: Insufficient documentation

## 2015-09-13 MED ORDER — AMOXICILLIN-POT CLAVULANATE 875-125 MG PO TABS: 1.0000 | ORAL_TABLET | Freq: Two times a day (BID) | ORAL | Status: DC

## 2015-09-13 NOTE — Progress Notes (Signed)
Name: Jenna White   MRN: [id number]    DOB: 12/01/1975   Date:09/13/2015       Progress Note  Subjective  Chief Complaint  Chief Complaint  Patient presents with  . Acute Visit    Sinus -  production yellowish and bloody     Sinusitis This is a new problem. Associated symptoms include congestion, headaches and sinus pressure. Pertinent negatives include no chills, coughing or sore throat. (Mainly post-nasal drainage, greenish mucus, sinus headaches and pressure.) Treatments tried: Flonase.    Past Medical History  Diagnosis Date  . Migraine   . GERD (gastroesophageal reflux disease)   . Migraines   . Allergy     Past Surgical History  Procedure Laterality Date  . Cholecystectomy N/A 02/25/2015    Procedure: LAPAROSCOPIC CHOLECYSTECTOMY WITH INTRAOPERATIVE CHOLANGIOGRAM;  Surgeon: Christene Lye, MD;  Location: [facility] ORS;  Service: General;  Laterality: N/A;    Family History  Problem Relation Age of Onset  . Cancer Mother     colon  . Diabetes Father     Social History   Social History  . Marital Status: Married    Spouse Name: N/A  . Number of Children: N/A  . Years of Education: N/A   Occupational History  . Not on file.   Social History Main Topics  . Smoking status: Never Smoker   . Smokeless tobacco: Never Used  . Alcohol Use: 0.0 oz/week    0 Standard drinks or equivalent per week     Comment: 1/day  . Drug Use: No  . Sexual Activity: Not on file   Other Topics Concern  . Not on file   Social History Narrative     Current outpatient prescriptions:  .  Cetirizine HCl 10 MG CAPS, Take by mouth., Disp: , Rfl:  .  Cholecalciferol (VITAMIN D PO), Take 4,000 mg by mouth daily., Disp: , Rfl:  .  fluticasone (FLONASE) 50 MCG/ACT nasal spray, Place 2 sprays into both nostrils daily., Disp: 16 g, Rfl: 0 .  ibuprofen (ADVIL,MOTRIN) 800 MG tablet, Take 800 mg by mouth every 8 (eight) hours as needed., Disp: , Rfl:  .  Lactobacillus  (ACIDOPHILUS PO), Take by mouth., Disp: , Rfl:  .  levonorgestrel (MIRENA) 20 MCG/24HR IUD, 1 each by Intrauterine route once., Disp: , Rfl:  .  meclizine (ANTIVERT) 25 MG tablet, Take 25 mg by mouth 3 (three) times daily as needed for dizziness., Disp: , Rfl:  .  topiramate (TOPAMAX) 200 MG tablet, Take 200 mg by mouth daily., Disp: , Rfl:   Allergies  Allergen Reactions  . Mupirocin Calcium   . Bacitracin Rash    Review of Systems  Constitutional: Negative for fever and chills.  HENT: Positive for congestion and sinus pressure. Negative for sore throat.   Respiratory: Negative for cough.   Neurological: Positive for headaches.     Objective  Filed Vitals:   09/13/15 1213  BP: 120/72  Pulse: 92  Temp: 98.3 F (36.8 C)  TempSrc: Oral  Resp: 14  Height: 5\' 4"  (1.626 m)  Weight: 152 lb (68.947 kg)  SpO2: 97%    Physical Exam  Constitutional: She is oriented to person, place, and time and well-developed, well-nourished, and in no distress.  HENT:  Nose: Mucosal edema present. No rhinorrhea. Right sinus exhibits no maxillary sinus tenderness and no frontal sinus tenderness. Left sinus exhibits no maxillary sinus tenderness and no frontal sinus tenderness.  Mouth/Throat: Mucous membranes are normal. Posterior  oropharyngeal erythema present.  Cardiovascular: Normal rate, regular rhythm and normal heart sounds.   Pulmonary/Chest: Effort normal and breath sounds normal.  Neurological: She is alert and oriented to person, place, and time.  Nursing note and vitals reviewed.   Assessment & Plan  1. Acute frontal sinusitis, recurrence not specified Symptoms consistent with acute sinusitis. Started on Augmentin. Advised to increase fluid intake.  - amoxicillin-clavulanate (AUGMENTIN) 875-125 MG tablet; Take 1 tablet by mouth 2 (two) times daily.  Dispense: 20 tablet; Refill: 0  Jenna White [facility] 09/13/2015 12:36 PM

## 2015-11-02 ENCOUNTER — Other Ambulatory Visit: Payer: Self-pay | Admitting: Student

## 2015-11-02 DIAGNOSIS — M7582 Other shoulder lesions, left shoulder: Secondary | ICD-10-CM

## 2015-11-17 ENCOUNTER — Encounter
Admission: RE | Disposition: A | Discharge status: Home / Self Care | Payer: Self-pay | Source: Ambulatory Visit | Attending: Surgery

## 2015-11-17 ENCOUNTER — Ambulatory Visit
Admission: RE | Admit: 2015-11-17 | Discharge: 2015-11-17 | Disposition: A | Discharge status: Home / Self Care | Payer: 59 | Source: Ambulatory Visit | Attending: Surgery | Admitting: Surgery

## 2015-11-17 ENCOUNTER — Ambulatory Visit: Payer: 59 | Admitting: Anesthesiology

## 2015-11-17 ENCOUNTER — Encounter: Payer: Self-pay | Admitting: *Deleted

## 2015-11-17 DIAGNOSIS — Z881 Allergy status to other antibiotic agents status: Secondary | ICD-10-CM | POA: Insufficient documentation

## 2015-11-17 DIAGNOSIS — M7542 Impingement syndrome of left shoulder: Secondary | ICD-10-CM | POA: Diagnosis present

## 2015-11-17 DIAGNOSIS — Z9049 Acquired absence of other specified parts of digestive tract: Secondary | ICD-10-CM | POA: Insufficient documentation

## 2015-11-17 DIAGNOSIS — K219 Gastro-esophageal reflux disease without esophagitis: Secondary | ICD-10-CM | POA: Insufficient documentation

## 2015-11-17 DIAGNOSIS — Z79899 Other long term (current) drug therapy: Secondary | ICD-10-CM | POA: Insufficient documentation

## 2015-11-17 HISTORY — PX: SHOULDER ARTHROSCOPY: SHX128

## 2015-11-17 LAB — POCT PREGNANCY, URINE: PREG TEST UR: NEGATIVE

## 2015-11-17 SURGERY — ARTHROSCOPY, SHOULDER
Anesthesia: General | Site: Shoulder | Laterality: Left | Wound class: Clean

## 2015-11-17 MED ORDER — CEFAZOLIN SODIUM-DEXTROSE 2-3 GM-% IV SOLR: 2.0000 g | Freq: Once | INTRAVENOUS | Status: DC

## 2015-11-17 MED ORDER — BUPIVACAINE-EPINEPHRINE (PF) 0.5% -1:200000 IJ SOLN
INTRAMUSCULAR | Status: DC | PRN
  Administered 2015-11-17: 25 mL via PERINEURAL

## 2015-11-17 MED ORDER — METOCLOPRAMIDE HCL 5 MG/ML IJ SOLN: 5.0000 mg | Freq: Three times a day (TID) | INTRAMUSCULAR | Status: DC | PRN

## 2015-11-17 MED ORDER — PROMETHAZINE HCL 25 MG/ML IJ SOLN
INTRAMUSCULAR | Status: AC
  Administered 2015-11-17: 6.25 mg via INTRAVENOUS
  Filled 2015-11-17: qty 1

## 2015-11-17 MED ORDER — MIDAZOLAM HCL 5 MG/5ML IJ SOLN
INTRAMUSCULAR | Status: AC
  Filled 2015-11-17: qty 5

## 2015-11-17 MED ORDER — PROPOFOL 10 MG/ML IV BOLUS
INTRAVENOUS | Status: DC | PRN
  Administered 2015-11-17: 140 mg via INTRAVENOUS

## 2015-11-17 MED ORDER — LIDOCAINE HCL (PF) 1 % IJ SOLN
INTRAMUSCULAR | Status: AC
  Filled 2015-11-17: qty 5

## 2015-11-17 MED ORDER — POTASSIUM CHLORIDE IN NACL 20-0.9 MEQ/L-% IV SOLN
INTRAVENOUS | Status: DC
  Filled 2015-11-17 (×5): qty 1000

## 2015-11-17 MED ORDER — LIDOCAINE HCL (CARDIAC) 20 MG/ML IV SOLN
INTRAVENOUS | Status: DC | PRN
  Administered 2015-11-17: 40 mg via INTRAVENOUS

## 2015-11-17 MED ORDER — ROCURONIUM BROMIDE 100 MG/10ML IV SOLN
INTRAVENOUS | Status: DC | PRN
  Administered 2015-11-17: 30 mg via INTRAVENOUS

## 2015-11-17 MED ORDER — ONDANSETRON HCL 4 MG/2ML IJ SOLN: 4.0000 mg | Freq: Four times a day (QID) | INTRAMUSCULAR | Status: DC | PRN

## 2015-11-17 MED ORDER — ROPIVACAINE HCL 2 MG/ML IJ SOLN
INTRAMUSCULAR | Status: AC
  Filled 2015-11-17: qty 40

## 2015-11-17 MED ORDER — FENTANYL CITRATE (PF) 100 MCG/2ML IJ SOLN
INTRAMUSCULAR | Status: AC
  Administered 2015-11-17: 25 ug via INTRAVENOUS
  Filled 2015-11-17: qty 2

## 2015-11-17 MED ORDER — SODIUM CHLORIDE 0.9 % IJ SOLN
INTRAMUSCULAR | Status: AC
  Filled 2015-11-17: qty 10

## 2015-11-17 MED ORDER — DEXAMETHASONE SODIUM PHOSPHATE 10 MG/ML IJ SOLN
INTRAMUSCULAR | Status: DC | PRN
  Administered 2015-11-17: 10 mg via INTRAVENOUS

## 2015-11-17 MED ORDER — ONDANSETRON HCL 4 MG PO TABS: 4.0000 mg | ORAL_TABLET | Freq: Four times a day (QID) | ORAL | Status: DC | PRN

## 2015-11-17 MED ORDER — LACTATED RINGERS IV SOLN
INTRAVENOUS | Status: DC
  Administered 2015-11-17: 12:00:00 via INTRAVENOUS

## 2015-11-17 MED ORDER — PROMETHAZINE HCL 25 MG/ML IJ SOLN
6.2500 mg | INTRAMUSCULAR | Status: AC | PRN
  Administered 2015-11-17 (×2): 6.25 mg via INTRAVENOUS

## 2015-11-17 MED ORDER — FENTANYL CITRATE (PF) 100 MCG/2ML IJ SOLN
INTRAMUSCULAR | Status: AC
  Filled 2015-11-17: qty 2

## 2015-11-17 MED ORDER — CEFAZOLIN SODIUM-DEXTROSE 2-3 GM-% IV SOLR
INTRAVENOUS | Status: AC
  Administered 2015-11-17: 2 g via INTRAVENOUS
  Filled 2015-11-17: qty 50

## 2015-11-17 MED ORDER — MIDAZOLAM HCL 2 MG/2ML IJ SOLN
2.0000 mg | Freq: Once | INTRAMUSCULAR | Status: AC
  Administered 2015-11-17: 2 mg via INTRAVENOUS

## 2015-11-17 MED ORDER — BUPIVACAINE-EPINEPHRINE (PF) 0.5% -1:200000 IJ SOLN
INTRAMUSCULAR | Status: AC
  Filled 2015-11-17: qty 30

## 2015-11-17 MED ORDER — NEOSTIGMINE METHYLSULFATE 10 MG/10ML IV SOLN
INTRAVENOUS | Status: DC | PRN
  Administered 2015-11-17: 4 mg via INTRAVENOUS

## 2015-11-17 MED ORDER — OXYCODONE HCL 5 MG PO TABS: 5.0000 mg | ORAL_TABLET | ORAL | Status: DC | PRN

## 2015-11-17 MED ORDER — METOCLOPRAMIDE HCL 10 MG PO TABS: 5.0000 mg | ORAL_TABLET | Freq: Three times a day (TID) | ORAL | Status: DC | PRN

## 2015-11-17 MED ORDER — ONDANSETRON HCL 4 MG/2ML IJ SOLN
INTRAMUSCULAR | Status: AC
  Administered 2015-11-17: 4 mg via INTRAVENOUS
  Filled 2015-11-17: qty 2

## 2015-11-17 MED ORDER — ONDANSETRON HCL 4 MG/2ML IJ SOLN
INTRAMUSCULAR | Status: DC | PRN
  Administered 2015-11-17: 4 mg via INTRAVENOUS

## 2015-11-17 MED ORDER — ONDANSETRON HCL 4 MG/2ML IJ SOLN
4.0000 mg | Freq: Once | INTRAMUSCULAR | Status: AC | PRN
  Administered 2015-11-17: 4 mg via INTRAVENOUS

## 2015-11-17 MED ORDER — FENTANYL CITRATE (PF) 100 MCG/2ML IJ SOLN
25.0000 ug | INTRAMUSCULAR | Status: DC | PRN
  Administered 2015-11-17 (×2): 25 ug via INTRAVENOUS

## 2015-11-17 MED ORDER — ESMOLOL HCL 100 MG/10ML IV SOLN
INTRAVENOUS | Status: DC | PRN
  Administered 2015-11-17: 50 mg via INTRAVENOUS

## 2015-11-17 MED ORDER — GLYCOPYRROLATE 0.2 MG/ML IJ SOLN
INTRAMUSCULAR | Status: DC | PRN
  Administered 2015-11-17: .6 mg via INTRAVENOUS

## 2015-11-17 MED ORDER — SODIUM CHLORIDE 0.9 % IV SOLN
INTRAVENOUS | Status: DC
  Administered 2015-11-17: 1 mL via INTRAVENOUS

## 2015-11-17 MED ORDER — EPINEPHRINE HCL 1 MG/ML IJ SOLN
INTRAMUSCULAR | Status: AC
  Filled 2015-11-17: qty 1

## 2015-11-17 MED ORDER — EPINEPHRINE HCL 1 MG/ML IJ SOLN
INTRAMUSCULAR | Status: DC | PRN
  Administered 2015-11-17: 2 mg

## 2015-11-17 SURGICAL SUPPLY — 51 items
BIT DRILL JUGRKNT W/NDL BIT2.9 (DRILL) ×2 IMPLANT
BLADE FULL RADIUS 3.5 (BLADE) ×6 IMPLANT
BLADE SHAVER 4.5X7 STR FR (MISCELLANEOUS) ×3 IMPLANT
BUR ACROMIONIZER 4.0 (BURR) ×3 IMPLANT
BUR BR 5.5 WIDE MOUTH (BURR) ×3 IMPLANT
CANNULA 8.5X75 THRED (CANNULA) ×3 IMPLANT
CANNULA SHAVER 8MMX76MM (CANNULA) ×3 IMPLANT
CHLORAPREP W/TINT 26ML (MISCELLANEOUS) ×3 IMPLANT
DRAPE IMP U-DRAPE 54X76 (DRAPES) ×6 IMPLANT
DRAPE SURG 17X11 SM STRL (DRAPES) ×3 IMPLANT
DRILL JUGGERKNOT W/NDL BIT 2.9 (DRILL) ×6
DRSG OPSITE POSTOP 4X8 (GAUZE/BANDAGES/DRESSINGS) IMPLANT
ELECT REM PT RETURN 9FT ADLT (ELECTROSURGICAL) ×3
ELECTRODE REM PT RTRN 9FT ADLT (ELECTROSURGICAL) ×1 IMPLANT
GAUZE PETRO XEROFOAM 1X8 (MISCELLANEOUS) ×3 IMPLANT
GAUZE SPONGE 4X4 12PLY STRL (GAUZE/BANDAGES/DRESSINGS) ×3 IMPLANT
GLOVE BIO SURGEON STRL SZ7.5 (GLOVE) ×6 IMPLANT
GLOVE BIO SURGEON STRL SZ8 (GLOVE) ×6 IMPLANT
GLOVE BIOGEL PI IND STRL 8 (GLOVE) ×1 IMPLANT
GLOVE BIOGEL PI INDICATOR 8 (GLOVE) ×2
GLOVE INDICATOR 8.0 STRL GRN (GLOVE) ×3 IMPLANT
GOWN STRL REUS W/ TWL LRG LVL3 (GOWN DISPOSABLE) ×2 IMPLANT
GOWN STRL REUS W/ TWL XL LVL3 (GOWN DISPOSABLE) ×1 IMPLANT
GOWN STRL REUS W/TWL LRG LVL3 (GOWN DISPOSABLE) ×4
GOWN STRL REUS W/TWL XL LVL3 (GOWN DISPOSABLE) ×2
GRASPER SUT 15 45D LOW PRO (SUTURE) ×6 IMPLANT
IV LACTATED RINGER IRRG 3000ML (IV SOLUTION) ×4
IV LR IRRIG 3000ML ARTHROMATIC (IV SOLUTION) ×2 IMPLANT
MANIFOLD NEPTUNE II (INSTRUMENTS) ×3 IMPLANT
MASK FACE SPIDER DISP (MASK) ×3 IMPLANT
MAT BLUE FLOOR 46X72 FLO (MISCELLANEOUS) ×3 IMPLANT
NDL MAYO CATGUT SZ4 (NEEDLE) IMPLANT
NEEDLE HYPO 22GX1.5 SAFETY (NEEDLE) ×3 IMPLANT
NEEDLE MAYO 6 CRC TAPER PT (NEEDLE) ×3 IMPLANT
NEEDLE MAYO CATGUT SZ 1.5 (NEEDLE)
NEEDLE MAYO CATGUT SZ 2 (NEEDLE) IMPLANT
NEEDLE REVERSE CUT 1/2 CRC (NEEDLE) ×3 IMPLANT
PACK ARTHROSCOPY SHOULDER (MISCELLANEOUS) ×3 IMPLANT
SLING ARM LRG DEEP (SOFTGOODS) ×3 IMPLANT
SLING ULTRA II LG (MISCELLANEOUS) ×3 IMPLANT
STAPLER SKIN PROX 35W (STAPLE) ×3 IMPLANT
STRAP SAFETY BODY (MISCELLANEOUS) ×3 IMPLANT
SUT ETHIBOND 0 MO6 C/R (SUTURE) ×3 IMPLANT
SUT PROLENE 4 0 PS 2 18 (SUTURE) IMPLANT
SUT VIC AB 2-0 CT1 27 (SUTURE) ×4
SUT VIC AB 2-0 CT1 TAPERPNT 27 (SUTURE) ×2 IMPLANT
TAPE MICROFOAM 4IN (TAPE) ×3 IMPLANT
TUBING ARTHRO INFLOW-ONLY STRL (TUBING) ×3 IMPLANT
TUBING CONNECTING 10 (TUBING) ×2 IMPLANT
TUBING CONNECTING 10' (TUBING) ×1
WAND HAND CNTRL MULTIVAC 90 (MISCELLANEOUS) ×3 IMPLANT

## 2015-11-17 NOTE — H&P (Signed)
Paper H&P to be scanned into permanent record. H&P reviewed. No changes. 

## 2015-11-17 NOTE — Anesthesia Preprocedure Evaluation (Signed)
Anesthesia Evaluation  Patient identified by MRN, date of birth, ID band Patient awake    Reviewed: Allergy & Precautions, H&P , NPO status , Patient's Chart, lab work & pertinent test results, reviewed documented beta blocker date and time   Airway Mallampati: II  TM Distance: >3 FB Neck ROM: full    Dental  (+) Teeth Intact   Pulmonary neg pulmonary ROS,    Pulmonary exam normal        Cardiovascular negative cardio ROS Normal cardiovascular exam Rhythm:regular Rate:Normal     Neuro/Psych  Headaches, negative neurological ROS  negative psych ROS   GI/Hepatic negative GI ROS, Neg liver ROS, GERD  ,  Endo/Other  negative endocrine ROS  Renal/GU negative Renal ROS  negative genitourinary   Musculoskeletal   Abdominal   Peds  Hematology negative hematology ROS (+)   Anesthesia Other Findings Past Medical History:   Migraine                                                     GERD (gastroesophageal reflux disease)                       Migraines                                                    Allergy                                                    Past Surgical History:   CHOLECYSTECTOMY                                 N/A 02/25/2015      Comment:Procedure: LAPAROSCOPIC CHOLECYSTECTOMY WITH               INTRAOPERATIVE CHOLANGIOGRAM;  Surgeon:               Christene Lye, MD;  Location: [facility] ORS;              Service: General;  Laterality: N/A; BMI    Body Mass Index   24.53 kg/m 2     Reproductive/Obstetrics negative OB ROS                             Anesthesia Physical Anesthesia Plan  ASA: II  Anesthesia Plan: General ETT   Post-op Pain Management: MAC Combined w/ Regional for Post-op pain   Induction:   Airway Management Planned:   Additional Equipment:   Intra-op Plan:   Post-operative Plan:   Informed Consent: I have reviewed the patients History and  Physical, chart, labs and discussed the procedure including the risks, benefits and alternatives for the proposed anesthesia with the patient or authorized representative who has indicated his/her understanding and acceptance.   Dental Advisory Given  Plan Discussed with: CRNA  Anesthesia Plan Comments:  Anesthesia Quick Evaluation  

## 2015-11-17 NOTE — Transfer of Care (Signed)
Immediate Anesthesia Transfer of Care Note  Patient: Jenna White  Procedure(s) Performed: Procedure(s): ARTHROSCOPY SHOULDER (Left)  Patient Location: PACU  Anesthesia Type:GA combined with regional for post-op pain  Level of Consciousness: awake, alert , oriented and patient cooperative  Airway & Oxygen Therapy: Patient Spontanous Breathing and Patient connected to nasal cannula oxygen  Post-op Assessment: Report given to RN, Post -op Vital signs reviewed and stable and Patient moving all extremities  Post vital signs: Reviewed and stable  Last Vitals:  Filed Vitals:   11/17/15 1245 11/17/15 1432  BP: 126/80 116/70  Pulse:  96  Temp:  36.3 C  Resp:  16    Complications: No apparent anesthesia complications

## 2015-11-17 NOTE — Op Note (Signed)
11/17/2015  2:32 PM  Patient:   Jenna White  Pre-Op Diagnosis:   Impingement/tendinopathy, left shoulder.  Postoperative diagnosis: Impingement/tendinopathy with labral fraying, left shoulder.  Procedure: Limited arthroscopic debridement and arthroscopic subacromial decompression, left shoulder.  Anesthesia: General endotracheal with interscalene block placed preoperatively by the anesthesiologist.  Surgeon:   Pascal Lux, MD  Assistant:   Luetta Nutting Race, PA-S  Findings: As above. There was some fraying of the labrum anteriorly without detachment from the glenoid. The rotator cuff was in excellent condition, as was the biceps tendon. The articular surfaces of the glenoid and humerus both were in excellent condition.  Complications: None  Fluids:   700 cc  Estimated blood loss: 2 cc  Tourniquet time: None  Drains: None  Closure: Staples   Brief clinical note: The patient is a 40 year old female with a four-month history of left shoulder pain which developed without any apparent cause or injury. The patient's symptoms have progressed despite medications, activity modification, physical therapy, etc. The patient's history and examination are consistent with impingement/tendinopathy. These findings were confirmed by MRI scan. The patient presents at this time for definitive management of these shoulder symptoms.  Procedure: The patient underwent placement of an interscalene block by the anesthesiologist before she was brought into the operating room and lain in the supine position. After adequate general endotracheal intubation and anesthesia were obtained, the patient was repositioned in the beach chair position using the beach chair positioner. The left shoulder and upper extremity were prepped with ChloraPrep solution before being draped sterilely. Preoperative antibiotics were administered. A timeout was performed to confirm the proper surgical site  before the expected portal sites and incision site were injected with 0.5% Sensorcaine with epinephrine. A posterior portal was created and the glenohumeral joint thoroughly inspected with the findings as described above. An anterior portal was created using an outside-in technique. The labrum and rotator cuff were further probed, again confirming the above-noted findings. The full-radius was inserted and used to lightly debride the area of labral fraying anteriorly. The ArthroCare wand was inserted and used to obtain hemostasis as well as to "anneal" the labrum anteriorly. The instruments were removed from the joint after suctioning the excess fluid.  The camera was repositioned through the posterior portal into the subacromial space. A separate lateral portal was created using an outside-in technique. The 3.5 mm full-radius resector was introduced and used to perform a subtotal bursectomy. The ArthroCare wand was then inserted and used to remove the periosteal tissue off the undersurface of the anterior third of the acromion as well as to recess the coracoacromial ligament from its attachment along the anterior and lateral margins of the acromion. The 4.0 mm acromionizing bur was introduced and used to complete the decompression by removing the undersurface of the anterior third of the acromion. The full radius resector was reintroduced to remove any residual bony debris before the ArthroCare wand was reintroduced to obtain hemostasis. The instruments were then removed from the subacromial space after suctioning the excess fluid.  The portal sites were closed using staples. A sterile bulky dressing was applied to the shoulder before the arm was placed into a shoulder sling. The patient was then awakened, extubated, and returned to the recovery room in satisfactory condition after tolerating the procedure well.

## 2015-11-17 NOTE — Anesthesia Procedure Notes (Addendum)
Anesthesia Regional Block:  Interscalene brachial plexus block  Pre-Anesthetic Checklist: ,, timeout performed, Correct Patient, Correct Site, Correct Laterality, Correct Procedure, Correct Position, site marked, Risks and benefits discussed,  Surgical consent,  Pre-op evaluation,  At surgeon's request and post-op pain management   Prep: Betadine       Needles:   Needle Type: Echogenic Stimulator Needle     Needle Length: 9cm 9 cm Needle Gauge: 21 and 21 G    Additional Needles:  Procedures: ultrasound guided (picture in chart) and nerve stimulator Interscalene brachial plexus block  Nerve Stimulator or Paresthesia:  Response: 100 mA, 3 cm  Additional Responses:   Narrative:  Injection made incrementally with aspirations every 5 mL.  Performed by: Personally  Anesthesiologist: Molli Barrows  Additional Notes: 25 cc ropiv with 1:200K epi     Anesthesia Regional Block:   Narrative:    Procedure Name: Intubation Date/Time: 11/17/2015 1:30 PM Performed by: Alda Berthold Pre-anesthesia Checklist: Patient identified, Patient being monitored, Timeout performed, Emergency Drugs available and Suction available Patient Re-evaluated:Patient Re-evaluated prior to inductionOxygen Delivery Method: Circle system utilized Preoxygenation: Pre-oxygenation with 100% oxygen Intubation Type: IV induction Ventilation: Mask ventilation without difficulty Laryngoscope Size: Mac and 3 Grade View: Grade I Tube type: Oral Tube size: 6.5 mm Number of attempts: 1 Airway Equipment and Method: LTA kit utilized (67m of 2% lidocaine) Placement Confirmation: ETT inserted through vocal cords under direct vision,  positive ETCO2 and breath sounds checked- equal and bilateral Secured at: 21 cm Tube secured with: Tape Dental Injury: Teeth and Oropharynx as per pre-operative assessment

## 2015-11-17 NOTE — OR Nursing (Signed)
Pt to [facility] in [place] for Block

## 2015-11-17 NOTE — Discharge Instructions (Addendum)
Keep dressing dry and intact.  °May shower after dressing changed on post-op day #4 (Monday).  °Cover staples/sutures with Band-Aids after drying off. °Apply ice frequently to shoulder. °Keep shoulder immobilizer on at all times except may remove for bathing purposes. °Follow-up in 10-14 days or as scheduled.AMBULATORY SURGERY  °DISCHARGE INSTRUCTIONS ° ° °1) The drugs that you were given will stay in your system until tomorrow so for the next 24 hours you should not: ° °A) Drive an automobile °B) Make any legal decisions °C) Drink any alcoholic beverage ° ° °2) You may resume regular meals tomorrow.  Today it is better to start with liquids and gradually work up to solid foods. ° °You may eat anything you prefer, but it is better to start with liquids, then soup and crackers, and gradually work up to solid foods. ° ° °3) Please notify your doctor immediately if you have any unusual bleeding, trouble breathing, redness and pain at the surgery site, drainage, fever, or pain not relieved by medication. ° ° ° °4) Additional Instructions: ° ° ° ° ° ° ° °Please contact your physician with any problems or Same Day Surgery at [phone], Monday through Friday 6 am to 4 pm, or [place] [facility] [place] Main number at [phone]. °

## 2015-11-17 NOTE — OR Nursing (Signed)
Report given to P Stall RN and Marcine Matar RN

## 2015-11-18 ENCOUNTER — Encounter: Payer: Self-pay | Admitting: Surgery

## 2015-11-18 NOTE — Anesthesia Postprocedure Evaluation (Signed)
Anesthesia Post Note  Patient: Jenna White  Procedure(s) Performed: Procedure(s) (LRB): ARTHROSCOPY SHOULDER acrominal repair, decompression left  (Left)  Patient location during evaluation: PACU Anesthesia Type: General Level of consciousness: awake Pain management: satisfactory to patient Vital Signs Assessment: post-procedure vital signs reviewed and stable Respiratory status: nonlabored ventilation Cardiovascular status: blood pressure returned to baseline Anesthetic complications: no    Last Vitals:  Filed Vitals:   11/17/15 1546 11/17/15 1603  BP: 130/86 106/88  Pulse: 80 85  Temp: 37.1 C 36.7 C  Resp: 11 16    Last Pain:  Filed Vitals:   11/17/15 1606  PainSc: 0-No pain                 VAN STAVEREN,Rosena Bartle

## 2015-11-23 ENCOUNTER — Ambulatory Visit: Payer: 59

## 2016-01-24 ENCOUNTER — Ambulatory Visit (INDEPENDENT_AMBULATORY_CARE_PROVIDER_SITE_OTHER): Payer: 59 | Admitting: Family Medicine

## 2016-01-24 ENCOUNTER — Encounter: Payer: Self-pay | Admitting: Family Medicine

## 2016-01-24 VITALS — BP 108/63 | HR 106 | Temp 98.0°F | Resp 18 | Ht 64.0 in | Wt 142.3 lb

## 2016-01-24 DIAGNOSIS — Z0000 Encounter for general adult medical examination without abnormal findings: Secondary | ICD-10-CM | POA: Insufficient documentation

## 2016-01-24 DIAGNOSIS — R0602 Shortness of breath: Secondary | ICD-10-CM | POA: Diagnosis not present

## 2016-01-24 DIAGNOSIS — R7989 Other specified abnormal findings of blood chemistry: Secondary | ICD-10-CM

## 2016-01-24 DIAGNOSIS — R946 Abnormal results of thyroid function studies: Secondary | ICD-10-CM

## 2016-01-24 MED ORDER — ALBUTEROL SULFATE HFA 108 (90 BASE) MCG/ACT IN AERS: 2.0000 | INHALATION_SPRAY | Freq: Four times a day (QID) | RESPIRATORY_TRACT | Status: DC | PRN

## 2016-01-24 NOTE — Progress Notes (Signed)
Name: Jenna White   MRN: [id number]    DOB: 1976-03-03   Date:01/24/2016       Progress Note  Subjective  Chief Complaint  Chief Complaint  Patient presents with  . Annual Exam    CPE    HPI  Pt. Is here for a Complete Physical Exam. She follows up with GYN for Pap Smear and Mammogram. Colonoscopy in 2016, no polyps, repeat in 2021. No personal or family history of heart disease.,   Past Medical History  Diagnosis Date  . Migraine   . GERD (gastroesophageal reflux disease)   . Migraines   . Allergy     Past Surgical History  Procedure Laterality Date  . Cholecystectomy N/A 02/25/2015    Procedure: LAPAROSCOPIC CHOLECYSTECTOMY WITH INTRAOPERATIVE CHOLANGIOGRAM;  Surgeon: Christene Lye, MD;  Location: [facility];  Service: General;  Laterality: N/A;  . Shoulder arthroscopy Left 11/17/2015    Procedure: ARTHROSCOPY SHOULDER acrominal repair, decompression left ;  Surgeon: Corky Mull, MD;  Location: [facility];  Service: Orthopedics;  Laterality: Left;    Family History  Problem Relation Age of Onset  . Cancer Mother     colon  . Diabetes Father     Social History   Social History  . Marital Status: Married    Spouse Name: N/A  . Number of Children: N/A  . Years of Education: N/A   Occupational History  . Not on file.   Social History Main Topics  . Smoking status: Never Smoker   . Smokeless tobacco: Never Used  . Alcohol Use: 0.0 oz/week    0 Standard drinks or equivalent per week     Comment: 1/day  . Drug Use: No  . Sexual Activity: Not on file   Other Topics Concern  . Not on file   Social History Narrative     Current outpatient prescriptions:  .  Cholecalciferol (VITAMIN D PO), Take 4,000 mg by mouth daily., Disp: , Rfl:  .  ibuprofen (ADVIL,MOTRIN) 800 MG tablet, Take 800 mg by mouth every 8 (eight) hours as needed., Disp: , Rfl:  .  Lactobacillus (ACIDOPHILUS PO), Take by mouth., Disp: , Rfl:  .  levonorgestrel (MIRENA) 20  MCG/24HR IUD, 1 each by Intrauterine route once., Disp: , Rfl:  .  meclizine (ANTIVERT) 25 MG tablet, Take 25 mg by mouth 3 (three) times daily as needed for dizziness., Disp: , Rfl:  .  topiramate (TOPAMAX) 200 MG tablet, Take 200 mg by mouth daily., Disp: , Rfl:   Allergies  Allergen Reactions  . Mupirocin Calcium   . Bacitracin Rash    Review of Systems  Constitutional: Positive for weight loss (intentional.) and malaise/fatigue. Negative for fever and chills.  HENT: Negative for congestion, ear pain and sore throat.   Eyes: Negative for blurred vision and double vision.  Respiratory: Positive for cough and shortness of breath.   Cardiovascular: Negative for chest pain.  Gastrointestinal: Negative for heartburn, nausea, vomiting, abdominal pain, diarrhea, constipation, blood in stool and melena.  Genitourinary: Negative for dysuria and hematuria.  Musculoskeletal: Positive for back pain (occasional back, leg and hip pain.). Negative for myalgias and joint pain.  Skin: Negative for itching and rash.  Neurological: Positive for dizziness (associated with Migraines) and headaches (history of migraines).  Psychiatric/Behavioral: Negative for depression. The patient is not nervous/anxious and does not have insomnia.     Objective  Filed Vitals:   01/24/16 1404  BP: 108/63  Pulse: 106  Temp:  98 F (36.7 C)  TempSrc: Oral  Resp: 18  Height: 5\' 4"  (1.626 m)  Weight: 142 lb 4.8 oz (64.547 kg)  SpO2: 97%    Physical Exam  Constitutional: She is oriented to person, place, and time and well-developed, well-nourished, and in no distress.  HENT:  Head: Normocephalic and atraumatic.  Right Ear: Tympanic membrane and ear canal normal.  Left Ear: Tympanic membrane and ear canal normal.  Nose: Right sinus exhibits no maxillary sinus tenderness. Left sinus exhibits no maxillary sinus tenderness.  Mouth/Throat: No posterior oropharyngeal erythema.  Nasal mucosal inflammation.   Eyes:  Conjunctivae are normal. Pupils are equal, round, and reactive to light.  Neck: Normal range of motion. Neck supple. No thyromegaly present.  Cardiovascular: Normal rate and regular rhythm.   Pulmonary/Chest: Effort normal and breath sounds normal. She has no wheezes. She has no rales.  Abdominal: Soft. Bowel sounds are normal. There is no tenderness.  Genitourinary:  Deferred.   Musculoskeletal:       Right ankle: She exhibits no swelling.       Left ankle: She exhibits no swelling.  Neurological: She is alert and oriented to person, place, and time.  Skin: Skin is warm and dry.  Psychiatric: Mood, memory, affect and judgment normal.  Nursing note and vitals reviewed.     Assessment & Plan  1. Well woman exam without gynecological exam Age appropriate laboratory studies obtained. Patient follows with gynecology for Pap smears and mammogram - CBC with Differential - Comprehensive Metabolic Panel (CMET) - Lipid Profile - Vitamin D (25 hydroxy)  2. Elevated TSH Elevated TSH at 4.550, normal free T4, and recheck today. - TSH  3. Shortness of breath We'll start on albuterol inhaler as needed for cough and shortness of breath.  - albuterol (PROVENTIL HFA;VENTOLIN HFA) 108 (90 Base) MCG/ACT inhaler; Inhale 2 puffs into the lungs every 6 (six) hours as needed for wheezing or shortness of breath.  Dispense: 1 Inhaler; Refill: 1   [facility] R[facility] 01/24/2016 2:23 PM

## 2016-01-25 ENCOUNTER — Telehealth: Payer: Self-pay | Admitting: Family Medicine

## 2016-01-25 NOTE — Telephone Encounter (Signed)
Requesting return call to discuss the inhaler that was prescribed yesterday. Insurance will not cover it, please send new inhaler to [facility]

## 2016-01-26 ENCOUNTER — Other Ambulatory Visit: Payer: Self-pay | Admitting: Family Medicine

## 2016-01-26 DIAGNOSIS — R0602 Shortness of breath: Secondary | ICD-10-CM

## 2016-01-26 MED ORDER — ALBUTEROL SULFATE HFA 108 (90 BASE) MCG/ACT IN AERS: 2.0000 | INHALATION_SPRAY | Freq: Four times a day (QID) | RESPIRATORY_TRACT | Status: DC | PRN

## 2016-01-28 LAB — COMPREHENSIVE METABOLIC PANEL
A/G RATIO: 1.6 (ref 1.2–2.2)
ALT: 14 IU/L (ref 0–32)
AST: 13 IU/L (ref 0–40)
Albumin: 4.7 g/dL (ref 3.5–5.5)
Alkaline Phosphatase: 63 IU/L (ref 39–117)
BILIRUBIN TOTAL: 0.6 mg/dL (ref 0.0–1.2)
BUN/Creatinine Ratio: 18 (ref 9–23)
BUN: 19 mg/dL (ref 6–20)
CHLORIDE: 102 mmol/L (ref 96–106)
CO2: 21 mmol/L (ref 18–29)
Calcium: 9.7 mg/dL (ref 8.7–10.2)
Creatinine, Ser: 1.07 mg/dL — ABNORMAL HIGH (ref 0.57–1.00)
GFR calc Af Amer: 76 mL/min/{1.73_m2} (ref >59)
GFR calc non Af Amer: 66 mL/min/{1.73_m2} (ref >59)
GLUCOSE: 87 mg/dL (ref 65–99)
Globulin, Total: 3 g/dL (ref 1.5–4.5)
POTASSIUM: 4.5 mmol/L (ref 3.5–5.2)
Sodium: 139 mmol/L (ref 134–144)
TOTAL PROTEIN: 7.7 g/dL (ref 6.0–8.5)

## 2016-01-28 LAB — CBC WITH DIFFERENTIAL/PLATELET
BASOS ABS: 0 10*3/uL (ref 0.0–0.2)
Basos: 0 %
EOS (ABSOLUTE): 0.1 10*3/uL (ref 0.0–0.4)
Eos: 2 %
Hematocrit: 43.9 % (ref 34.0–46.6)
Hemoglobin: 14.8 g/dL (ref 11.1–15.9)
IMMATURE GRANS (ABS): 0 10*3/uL (ref 0.0–0.1)
Immature Granulocytes: 0 %
LYMPHS: 37 %
Lymphocytes Absolute: 1.8 10*3/uL (ref 0.7–3.1)
MCH: 30.9 pg (ref 26.6–33.0)
MCHC: 33.7 g/dL (ref 31.5–35.7)
MCV: 92 fL (ref 79–97)
MONOS ABS: 0.3 10*3/uL (ref 0.1–0.9)
Monocytes: 6 %
NEUTROS ABS: 2.6 10*3/uL (ref 1.4–7.0)
Neutrophils: 55 %
PLATELETS: 235 10*3/uL (ref 150–379)
RBC: 4.79 x10E6/uL (ref 3.77–5.28)
RDW: 13.7 % (ref 12.3–15.4)
WBC: 4.8 10*3/uL (ref 3.4–10.8)

## 2016-01-28 LAB — TSH: TSH: 4.38 u[IU]/mL (ref 0.450–4.500)

## 2016-01-28 LAB — LIPID PANEL
CHOL/HDL RATIO: 6.6 ratio — AB (ref 0.0–4.4)
CHOLESTEROL TOTAL: 197 mg/dL (ref 100–199)
HDL: 30 mg/dL — AB (ref >39)
LDL Calculated: 124 mg/dL — ABNORMAL HIGH (ref 0–99)
TRIGLYCERIDES: 217 mg/dL — AB (ref 0–149)
VLDL CHOLESTEROL CAL: 43 mg/dL — AB (ref 5–40)

## 2016-01-28 LAB — VITAMIN D 25 HYDROXY (VIT D DEFICIENCY, FRACTURES): Vit D, 25-Hydroxy: 54.7 ng/mL (ref 30.0–100.0)

## 2016-01-30 NOTE — Telephone Encounter (Signed)
Routed to Dr. Manuella Ghazi for new prescription approval

## 2016-01-31 NOTE — Telephone Encounter (Signed)
Prescription for albuterol inhaler has been changed and sent to pharmacy. Patient has picked up prescription.

## 2016-02-14 ENCOUNTER — Other Ambulatory Visit: Payer: Self-pay | Admitting: Neurology

## 2016-02-14 DIAGNOSIS — G43809 Other migraine, not intractable, without status migrainosus: Secondary | ICD-10-CM

## 2016-02-14 DIAGNOSIS — G43109 Migraine with aura, not intractable, without status migrainosus: Principal | ICD-10-CM

## 2016-03-01 ENCOUNTER — Ambulatory Visit
Admission: RE | Admit: 2016-03-01 | Discharge: 2016-03-01 | Disposition: A | Discharge status: Home / Self Care | Payer: 59 | Source: Ambulatory Visit | Attending: Neurology | Admitting: Neurology

## 2016-03-01 DIAGNOSIS — G43809 Other migraine, not intractable, without status migrainosus: Secondary | ICD-10-CM

## 2016-03-01 DIAGNOSIS — R42 Dizziness and giddiness: Secondary | ICD-10-CM | POA: Insufficient documentation

## 2016-03-01 DIAGNOSIS — G43109 Migraine with aura, not intractable, without status migrainosus: Secondary | ICD-10-CM | POA: Insufficient documentation

## 2016-03-01 MED ORDER — GADOBENATE DIMEGLUMINE 529 MG/ML IV SOLN
15.0000 mL | Freq: Once | INTRAVENOUS | Status: AC | PRN
  Administered 2016-03-01: 13 mL via INTRAVENOUS

## 2017-02-12 ENCOUNTER — Encounter: Payer: 59 | Admitting: Family Medicine

## 2017-02-13 ENCOUNTER — Encounter: Payer: Self-pay | Admitting: Family Medicine

## 2017-02-13 ENCOUNTER — Ambulatory Visit (INDEPENDENT_AMBULATORY_CARE_PROVIDER_SITE_OTHER): Payer: 59 | Admitting: Family Medicine

## 2017-02-13 VITALS — BP 110/64 | HR 83 | Temp 97.9°F | Resp 16 | Ht 64.0 in | Wt 155.8 lb

## 2017-02-13 DIAGNOSIS — E781 Pure hyperglyceridemia: Secondary | ICD-10-CM

## 2017-02-13 DIAGNOSIS — Z0001 Encounter for general adult medical examination with abnormal findings: Secondary | ICD-10-CM

## 2017-02-13 DIAGNOSIS — L989 Disorder of the skin and subcutaneous tissue, unspecified: Secondary | ICD-10-CM

## 2017-02-13 DIAGNOSIS — Z0000 Encounter for general adult medical examination without abnormal findings: Secondary | ICD-10-CM

## 2017-02-13 NOTE — Progress Notes (Signed)
Name: Jenna White   MRN: [id number]    DOB: Jul 07, 1976   Date:02/13/2017       Progress Note  Subjective  Chief Complaint  Chief Complaint  Patient presents with  . Annual Exam    CPE w/o Pap    HPI  Patient presents for Comprehensive Physical.  She is due for colonoscopy in 2021, sees Gynecology for female exam and screenings.     Past Medical History:  Diagnosis Date  . Allergy   . GERD (gastroesophageal reflux disease)   . Migraine   . Migraines     Past Surgical History:  Procedure Laterality Date  . CHOLECYSTECTOMY N/A 02/25/2015   Procedure: LAPAROSCOPIC CHOLECYSTECTOMY WITH INTRAOPERATIVE CHOLANGIOGRAM;  Surgeon: Christene Lye, MD;  Location: [facility];  Service: General;  Laterality: N/A;  . SHOULDER ARTHROSCOPY Left 11/17/2015   Procedure: ARTHROSCOPY SHOULDER acrominal repair, decompression left ;  Surgeon: Corky Mull, MD;  Location: [facility];  Service: Orthopedics;  Laterality: Left;    Family History  Problem Relation Age of Onset  . Cancer Mother        colon  . Diabetes Father     Social History   Social History  . Marital status: Married    Spouse name: N/A  . Number of children: N/A  . Years of education: N/A   Occupational History  . Not on file.   Social History Main Topics  . Smoking status: Never Smoker  . Smokeless tobacco: Never Used  . Alcohol use 0.0 oz/week     Comment: 1/day  . Drug use: No  . Sexual activity: Not on file   Other Topics Concern  . Not on file   Social History Narrative  . No narrative on file     Current Outpatient Prescriptions:  .  albuterol (PROVENTIL HFA;VENTOLIN HFA) 108 (90 Base) MCG/ACT inhaler, Inhale 2 puffs into the lungs every 6 (six) hours as needed for wheezing or shortness of breath., Disp: 1 Inhaler, Rfl: 0 .  Cholecalciferol (VITAMIN D PO), Take 4,000 mg by mouth daily., Disp: , Rfl:  .  ibuprofen (ADVIL,MOTRIN) 800 MG tablet, Take 800 mg by mouth every 8 (eight) hours as  needed., Disp: , Rfl:  .  Lactobacillus (ACIDOPHILUS PO), Take by mouth., Disp: , Rfl:  .  levonorgestrel (MIRENA) 20 MCG/24HR IUD, 1 each by Intrauterine route once., Disp: , Rfl:  .  meclizine (ANTIVERT) 25 MG tablet, Take 25 mg by mouth 3 (three) times daily as needed for dizziness., Disp: , Rfl:  .  topiramate (TOPAMAX) 200 MG tablet, Take 200 mg by mouth daily., Disp: , Rfl:  .  vitamin B-12 (CYANOCOBALAMIN) 1000 MCG tablet, Take 1,000 mcg by mouth daily., Disp: , Rfl:   Allergies  Allergen Reactions  . Mupirocin Calcium   . Bacitracin Rash     Review of Systems  Constitutional: Positive for malaise/fatigue (chronic fatigue, struggles to stay awake.). Negative for chills, fever and weight loss.  HENT: Negative for congestion, sore throat and tinnitus.   Eyes: Negative for blurred vision and double vision.  Respiratory: Negative for cough, sputum production and shortness of breath.   Cardiovascular: Negative for chest pain and leg swelling.  Gastrointestinal: Negative for abdominal pain, blood in stool, constipation, melena, nausea and vomiting.  Genitourinary: Negative for dysuria and hematuria.  Musculoskeletal: Negative for back pain and neck pain.  Neurological: Positive for headaches (chronic migraines). Negative for dizziness.  Psychiatric/Behavioral: Negative for depression. The patient is not  nervous/anxious and does not have insomnia.     Objective  Vitals:   02/13/17 1355  BP: 110/64  Pulse: 83  Resp: 16  Temp: 97.9 F (36.6 C)  TempSrc: Oral  SpO2: 99%  Weight: 155 lb 12.8 oz (70.7 kg)  Height: 5\' 4"  (1.626 m)    Physical Exam  Constitutional: She is well-developed, well-nourished, and in no distress.  HENT:  Head: Normocephalic and atraumatic.  Right Ear: External ear normal.  Left Ear: External ear normal.  Mouth/Throat: Oropharynx is clear and moist.  Eyes: Pupils are equal, round, and reactive to light.  Neck: Normal range of motion. Neck supple.  No thyromegaly present.  Cardiovascular: Normal rate, regular rhythm and normal heart sounds.   No murmur heard. Pulmonary/Chest: Effort normal and breath sounds normal. She has no wheezes.  Abdominal: Soft. Bowel sounds are normal. There is no tenderness.  Musculoskeletal: She exhibits no edema.  Neurological: She is alert.  Skin: Lesion (dome shaped well circumscribed painless non draining lesion over the right distal thigh) noted.     Psychiatric: Mood, memory, affect and judgment normal.  Nursing note and vitals reviewed.      Assessment & Plan  1. Well woman exam without gynecological exam Obtain age-appropriate lab work - CBC with Differential/Platelet - COMPLETE METABOLIC PANEL WITH GFR - TSH - VITAMIN D 25 Hydroxy (Vit-D Deficiency, Fractures)  2. Hypertriglyceridemia Repeat FLP, consider starting on triglyceride lowering therapy - Lipid panel  3. Changing skin lesion  - Ambulatory referral to Dermatology   [facility] 02/13/2017 2:25 PM

## 2017-02-20 ENCOUNTER — Encounter: Payer: Self-pay | Admitting: Obstetrics and Gynecology

## 2017-02-20 ENCOUNTER — Ambulatory Visit (INDEPENDENT_AMBULATORY_CARE_PROVIDER_SITE_OTHER): Payer: 59 | Admitting: Obstetrics and Gynecology

## 2017-02-20 VITALS — BP 110/70 | HR 83 | Ht 64.0 in | Wt 155.0 lb

## 2017-02-20 DIAGNOSIS — Z1231 Encounter for screening mammogram for malignant neoplasm of breast: Secondary | ICD-10-CM | POA: Diagnosis not present

## 2017-02-20 DIAGNOSIS — Z1239 Encounter for other screening for malignant neoplasm of breast: Secondary | ICD-10-CM

## 2017-02-20 DIAGNOSIS — Z30431 Encounter for routine checking of intrauterine contraceptive device: Secondary | ICD-10-CM

## 2017-02-20 DIAGNOSIS — Z01419 Encounter for gynecological examination (general) (routine) without abnormal findings: Secondary | ICD-10-CM | POA: Diagnosis not present

## 2017-02-20 NOTE — Progress Notes (Signed)
Chief Complaint  Patient presents with  . Gynecologic Exam     HPI:      [name] is a 41 y.o. G3P3 who LMP was No LMP recorded. Patient is not currently having periods (Reason: IUD)., presents today for her annual examination.  Her menses are absent due to the IUD.  Dysmenorrhea none. She does not have intermenstrual bleeding.  Sex activity: single partner, contraception - IUD. Mirena placed 05/07/12. She would like another one when it's due to be removed. Strings were palpable last yr but not visible. Last Pap: Feb 14, 2016  Results were: no abnormalities /neg HPV DNA  Hx of STDs: none  There is no FH of breast cancer. There is no FH of ovarian cancer. The patient does do self-breast exams. The pt's mom had colon cancer and pt had neg colonoscopy in 2016. She is due for repeat in 5 yrs.  Tobacco use: The patient denies current or previous tobacco use. Alcohol use: none Exercise: moderately active  She does get adequate calcium and Vitamin D in her diet.  She has labs with her PCP.  Past Medical History:  Diagnosis Date  . Allergy   . GERD (gastroesophageal reflux disease)   . Migraine   . Migraines     Past Surgical History:  Procedure Laterality Date  . CHOLECYSTECTOMY N/A 02/25/2015   Procedure: LAPAROSCOPIC CHOLECYSTECTOMY WITH INTRAOPERATIVE CHOLANGIOGRAM;  Surgeon: Christene Lye, MD;  Location: [facility];  Service: General;  Laterality: N/A;  . SHOULDER ARTHROSCOPY Left 11/17/2015   Procedure: ARTHROSCOPY SHOULDER acrominal repair, decompression left ;  Surgeon: Corky Mull, MD;  Location: [facility];  Service: Orthopedics;  Laterality: Left;    Family History  Problem Relation Age of Onset  . Cancer Mother        colon  . Diabetes Father     Social History   Social History  . Marital status: Married    Spouse name: N/A  . Number of children: N/A  . Years of education: N/A   Occupational History  . Not on file.   Social History Main  Topics  . Smoking status: Never Smoker  . Smokeless tobacco: Never Used  . Alcohol use 0.0 oz/week     Comment: 1/day  . Drug use: No  . Sexual activity: Yes    Birth control/ protection: IUD   Other Topics Concern  . Not on file   Social History Narrative  . No narrative on file     Current Outpatient Prescriptions:  .  albuterol (PROVENTIL HFA;VENTOLIN HFA) 108 (90 Base) MCG/ACT inhaler, Inhale 2 puffs into the lungs every 6 (six) hours as needed for wheezing or shortness of breath., Disp: 1 Inhaler, Rfl: 0 .  Cholecalciferol (VITAMIN D PO), Take 4,000 mg by mouth daily., Disp: , Rfl:  .  ibuprofen (ADVIL,MOTRIN) 800 MG tablet, Take 800 mg by mouth every 8 (eight) hours as needed., Disp: , Rfl:  .  Lactobacillus (ACIDOPHILUS PO), Take by mouth., Disp: , Rfl:  .  levonorgestrel (MIRENA) 20 MCG/24HR IUD, 1 each by Intrauterine route once., Disp: , Rfl:  .  meclizine (ANTIVERT) 25 MG tablet, Take 25 mg by mouth 3 (three) times daily as needed for dizziness., Disp: , Rfl:  .  topiramate (TOPAMAX) 200 MG tablet, Take 200 mg by mouth daily., Disp: , Rfl:  .  vitamin B-12 (CYANOCOBALAMIN) 1000 MCG tablet, Take 1,000 mcg by mouth daily., Disp: , Rfl:   ROS:  Review of Systems  Constitutional: Negative for fatigue, fever and unexpected weight change.  Respiratory: Negative for cough, shortness of breath and wheezing.   Cardiovascular: Negative for chest pain, palpitations and leg swelling.  Gastrointestinal: Negative for blood in stool, constipation, diarrhea, nausea and vomiting.  Endocrine: Negative for cold intolerance, heat intolerance and polyuria.  Genitourinary: Negative for dyspareunia, dysuria, flank pain, frequency, genital sores, hematuria, menstrual problem, pelvic pain, urgency, vaginal bleeding, vaginal discharge and vaginal pain.  Musculoskeletal: Negative for back pain, joint swelling and myalgias.  Skin: Negative for rash.  Neurological: Positive for headaches.  Negative for dizziness, syncope, light-headedness and numbness.  Hematological: Negative for adenopathy.  Psychiatric/Behavioral: Negative for agitation, confusion, sleep disturbance and suicidal ideas. The patient is not nervous/anxious.      Objective: BP 110/70   Pulse 83   Ht 5\' 4"  (1.626 m)   Wt 155 lb (70.3 kg)   BMI 26.61 kg/m    Physical Exam  Constitutional: She is oriented to person, place, and time. She appears well-developed and well-nourished.  Genitourinary: Vagina normal and uterus normal. There is no rash or tenderness on the right labia. There is no rash or tenderness on the left labia. No erythema or tenderness in the vagina. No vaginal discharge found. Right adnexum does not display mass and does not display tenderness. Left adnexum does not display mass and does not display tenderness. Cervix does not exhibit motion tenderness or polyp. Uterus is not enlarged or tender.  Genitourinary Comments: IUD STRINGS NOT VISIBLE OR PALPABLE  Neck: Normal range of motion. No thyromegaly present.  Cardiovascular: Normal rate, regular rhythm and normal heart sounds.   No murmur heard. Pulmonary/Chest: Effort normal and breath sounds normal. Right breast exhibits no mass, no nipple discharge, no skin change and no tenderness. Left breast exhibits no mass, no nipple discharge, no skin change and no tenderness.  Abdominal: Soft. There is no tenderness. There is no guarding.  Musculoskeletal: Normal range of motion.  Neurological: She is alert and oriented to person, place, and time. No cranial nerve deficit.  Psychiatric: She has a normal mood and affect. Her behavior is normal.  Vitals reviewed.   Assessment/Plan: Encounter for annual routine gynecological examination  Screening for breast cancer - Pt to sched mammo - Plan: MM DIGITAL SCREENING BILATERAL  Encounter for routine checking of intrauterine contraceptive device (IUD) - IUD not seen/felt. RTO with MD 7/18 for removal  and replacement. NSAIDs.             GYN counsel mammography screening, adequate intake of calcium and vitamin D     F/U  Return in about 1 year (around 02/20/2018).  Alicia B. Copland, PA-C 02/20/2017 1:56 PM

## 2017-02-21 ENCOUNTER — Telehealth: Payer: Self-pay | Admitting: Family Medicine

## 2017-02-21 NOTE — Telephone Encounter (Signed)
Spoke with patient and she has been notified that once results are reviewed and released by Dr. Manuella Ghazi I will call her with results

## 2017-02-21 NOTE — Telephone Encounter (Signed)
Pt has received lab results through mychart and would like a return call [phone]

## 2017-02-27 ENCOUNTER — Other Ambulatory Visit: Payer: Self-pay

## 2017-02-27 ENCOUNTER — Encounter: Payer: Self-pay | Admitting: Obstetrics and Gynecology

## 2017-02-27 DIAGNOSIS — R7989 Other specified abnormal findings of blood chemistry: Secondary | ICD-10-CM

## 2017-02-28 ENCOUNTER — Encounter: Payer: Self-pay | Admitting: Family Medicine

## 2017-03-01 LAB — T3, FREE: T3, Free: 3.2 pg/mL (ref 2.0–4.4)

## 2017-03-01 LAB — T4, FREE: Free T4: 1.08 ng/dL (ref 0.82–1.77)

## 2017-03-12 ENCOUNTER — Emergency Department
Admission: EM | Admit: 2017-03-12 | Discharge: 2017-03-13 | Disposition: A | Discharge status: Home / Self Care | Payer: 59 | Attending: Emergency Medicine | Admitting: Emergency Medicine

## 2017-03-12 ENCOUNTER — Emergency Department: Payer: 59

## 2017-03-12 ENCOUNTER — Encounter: Payer: Self-pay | Admitting: Obstetrics and Gynecology

## 2017-03-12 DIAGNOSIS — N72 Inflammatory disease of cervix uteri: Secondary | ICD-10-CM | POA: Diagnosis not present

## 2017-03-12 DIAGNOSIS — Z79899 Other long term (current) drug therapy: Secondary | ICD-10-CM | POA: Diagnosis not present

## 2017-03-12 DIAGNOSIS — R1030 Lower abdominal pain, unspecified: Secondary | ICD-10-CM | POA: Diagnosis present

## 2017-03-12 LAB — COMPREHENSIVE METABOLIC PANEL
ALT: 24 U/L (ref 14–54)
AST: 21 U/L (ref 15–41)
Albumin: 4.1 g/dL (ref 3.5–5.0)
Alkaline Phosphatase: 54 U/L (ref 38–126)
Anion gap: 7 (ref 5–15)
BUN: 14 mg/dL (ref 6–20)
CO2: 24 mmol/L (ref 22–32)
CREATININE: 1.03 mg/dL — AB (ref 0.44–1.00)
Calcium: 9.4 mg/dL (ref 8.9–10.3)
Chloride: 108 mmol/L (ref 101–111)
Glucose, Bld: 112 mg/dL — ABNORMAL HIGH (ref 65–99)
Potassium: 4.3 mmol/L (ref 3.5–5.1)
Sodium: 139 mmol/L (ref 135–145)
TOTAL PROTEIN: 7.4 g/dL (ref 6.5–8.1)
Total Bilirubin: 0.3 mg/dL (ref 0.3–1.2)

## 2017-03-12 LAB — CBC
HEMATOCRIT: 41.2 % (ref 35.0–47.0)
HEMOGLOBIN: 14.4 g/dL (ref 12.0–16.0)
MCH: 30.8 pg (ref 26.0–34.0)
MCHC: 34.9 g/dL (ref 32.0–36.0)
MCV: 88.3 fL (ref 80.0–100.0)
Platelets: 251 10*3/uL (ref 150–440)
RBC: 4.66 MIL/uL (ref 3.80–5.20)
RDW: 12.7 % (ref 11.5–14.5)
WBC: 7.6 10*3/uL (ref 3.6–11.0)

## 2017-03-12 LAB — URINALYSIS, COMPLETE (UACMP) WITH MICROSCOPIC
Bilirubin Urine: NEGATIVE
Glucose, UA: NEGATIVE mg/dL
Hgb urine dipstick: NEGATIVE
Ketones, ur: NEGATIVE mg/dL
Leukocytes, UA: NEGATIVE
Nitrite: NEGATIVE
PROTEIN: NEGATIVE mg/dL
SPECIFIC GRAVITY, URINE: 1.004 — AB (ref 1.005–1.030)
pH: 7 (ref 5.0–8.0)

## 2017-03-12 LAB — LIPASE, BLOOD: Lipase: 40 U/L (ref 11–51)

## 2017-03-12 LAB — PREGNANCY, URINE: Preg Test, Ur: NEGATIVE

## 2017-03-12 MED ORDER — MORPHINE SULFATE (PF) 2 MG/ML IV SOLN
2.0000 mg | Freq: Once | INTRAVENOUS | Status: AC
  Administered 2017-03-12: 2 mg via INTRAVENOUS
  Filled 2017-03-12: qty 1

## 2017-03-12 MED ORDER — ONDANSETRON HCL 4 MG/2ML IJ SOLN
4.0000 mg | Freq: Once | INTRAMUSCULAR | Status: AC
  Administered 2017-03-12: 4 mg via INTRAVENOUS
  Filled 2017-03-12: qty 2

## 2017-03-12 MED ORDER — IOPAMIDOL (ISOVUE-300) INJECTION 61%
100.0000 mL | Freq: Once | INTRAVENOUS | Status: AC | PRN
  Administered 2017-03-12: 100 mL via INTRAVENOUS

## 2017-03-12 NOTE — ED Provider Notes (Signed)
[facility] Emergency Department Provider Note    First MD Initiated Contact with Patient 03/12/17 2305     (approximate)  I have reviewed the triage vital signs and the nursing notes.   HISTORY  Chief Complaint Abdominal Pain   HPI Jenna White is a 41 y.o. female with below list of chronic list of medical conditions presents to the emergency department with acute onset of 10 out of 10 sharp lower abdominal pain with onset at 7:30 PM tonight. Patient states that the pain was associated with nausea no vomiting. Patient denies any exacerbation diarrhea. Patient denies any fever   Past Medical History:  Diagnosis Date  . Allergy   . GERD (gastroesophageal reflux disease)   . Migraine   . Migraines     Patient Active Problem List   Diagnosis Date Noted  . Hypertriglyceridemia 02/13/2017  . Well woman exam without gynecological exam 01/24/2016  . Shortness of breath 01/24/2016  . Acute sinusitis 09/13/2015  . Elevated TSH 05/25/2015  . Post-nasal drainage 05/25/2015  . Headache, migraine 05/25/2015  . Migraine variant 01/13/2015  . Headache, tension-type 05/06/2012    Past Surgical History:  Procedure Laterality Date  . CHOLECYSTECTOMY N/A 02/25/2015   Procedure: LAPAROSCOPIC CHOLECYSTECTOMY WITH INTRAOPERATIVE CHOLANGIOGRAM;  Surgeon: Christene Lye, MD;  Location: [facility] ORS;  Service: General;  Laterality: N/A;  . SHOULDER ARTHROSCOPY Left 11/17/2015   Procedure: ARTHROSCOPY SHOULDER acrominal repair, decompression left ;  Surgeon: Corky Mull, MD;  Location: [facility] ORS;  Service: Orthopedics;  Laterality: Left;    Prior to Admission medications   Medication Sig Start Date End Date Taking? Authorizing Provider  albuterol (PROVENTIL HFA;VENTOLIN HFA) 108 (90 Base) MCG/ACT inhaler Inhale 2 puffs into the lungs every 6 (six) hours as needed for wheezing or shortness of breath. 01/26/16   Roselee Nova, MD  Cholecalciferol (VITAMIN D  PO) Take 4,000 mg by mouth daily.    [provider]  doxycycline (VIBRAMYCIN) 50 MG capsule Take 2 capsules (100 mg total) by mouth 2 (two) times daily. 03/13/17 03/23/17  Gregor Hams, MD  ibuprofen (ADVIL,MOTRIN) 800 MG tablet Take 800 mg by mouth every 8 (eight) hours as needed.    [provider]  Lactobacillus (ACIDOPHILUS PO) Take by mouth.    [provider]  levonorgestrel (MIRENA) 20 MCG/24HR IUD 1 each by Intrauterine route once.    [provider]  meclizine (ANTIVERT) 25 MG tablet Take 25 mg by mouth 3 (three) times daily as needed for dizziness.    [provider]  topiramate (TOPAMAX) 200 MG tablet Take 200 mg by mouth daily.    [provider]  vitamin B-12 (CYANOCOBALAMIN) 1000 MCG tablet Take 1,000 mcg by mouth daily.    [provider]    Allergies Mupirocin calcium and Bacitracin  Family History  Problem Relation Age of Onset  . Cancer Mother 49       colon  . Diabetes Father     Social History Social History  Substance Use Topics  . Smoking status: Never Smoker  . Smokeless tobacco: Never Used  . Alcohol use 0.0 oz/week     Comment: 1/day    Review of Systems Constitutional: No fever/chills Eyes: No visual changes. ENT: No sore throat. Cardiovascular: Denies chest pain. Respiratory: Denies shortness of breath. Gastrointestinal: Positive for abdominal pain and nausea., no vomiting.  No diarrhea.  No constipation. Genitourinary: Negative for dysuria. Musculoskeletal: Negative for neck pain.  Negative for back pain. Integumentary: Negative for rash. Neurological: Negative for headaches, focal weakness or numbness.   ____________________________________________   PHYSICAL EXAM:  VITAL SIGNS: ED Triage Vitals  Enc Vitals Group     BP 03/12/17 2020 (!) 144/93     Pulse Rate 03/12/17 2020 88     Resp 03/12/17 2020 16     Temp 03/12/17 2020 98 F (36.7 C)     Temp Source 03/12/17 2020  Oral     SpO2 03/12/17 2020 100 %     Weight 03/12/17 2019 69.4 kg (153 lb)     Height 03/12/17 2019 1.626 m (5\' 4" )     Head Circumference --      Peak Flow --      Pain Score 03/12/17 2019 10     Pain Loc --      Pain Edu? --      Excl. in [place]? --     Constitutional: Alert and oriented. Well appearing and in no acute distress. Eyes: Conjunctivae are normal. Head: Atraumatic. Mouth/Throat: Mucous membranes are moist. Oropharynx non-erythematous. Neck: No stridor.  Cardiovascular: Normal rate, regular rhythm. Good peripheral circulation. Grossly normal heart sounds. Respiratory: Normal respiratory effort.  No retractions. Lungs CTAB. Gastrointestinal: Left lower quadrant and suprapubic tenderness to palpation. No distention.  Genitourinary: Cervical erythema noted with clear drainage noted from the cervical os. Of note IUD strings not visualized Musculoskeletal: No lower extremity tenderness nor edema. No gross deformities of extremities. Neurologic:  Normal speech and language. No gross focal neurologic deficits are appreciated.  Skin:  Skin is warm, dry and intact. No rash noted. Psychiatric: Mood and affect are normal. Speech and behavior are normal.  ____________________________________________   LABS (all labs ordered are listed, but only abnormal results are displayed)  Labs Reviewed  WET PREP, GENITAL - Abnormal; Notable for the following:       Result Value   WBC, Wet Prep HPF POC FEW (*)    All other components within normal limits  COMPREHENSIVE METABOLIC PANEL - Abnormal; Notable for the following:    Glucose, Bld 112 (*)    Creatinine, Ser 1.03 (*)    All other components within normal limits  URINALYSIS, COMPLETE (UACMP) WITH MICROSCOPIC - Abnormal; Notable for the following:    Color, Urine STRAW (*)    APPearance HAZY (*)    Specific Gravity, Urine 1.004 (*)    Bacteria, UA RARE (*)    Squamous Epithelial / LPF 0-5 (*)    All other components within normal  limits  CHLAMYDIA/NGC RT PCR ([facility] ONLY)  LIPASE, BLOOD  CBC  PREGNANCY, URINE    RADIOLOGY I, [place] N Kinlie Janice, personally viewed and evaluated these images (plain radiographs) as part of my medical decision making, as well as reviewing the written report by the radiologist.  Ct Abdomen Pelvis W Contrast  Result Date: 03/13/2017 CLINICAL DATA:  Severe lower abdominal pain for 1 hour, nausea and urinary urgency. EXAM: CT ABDOMEN AND PELVIS WITH CONTRAST TECHNIQUE: Multidetector CT imaging of the abdomen and pelvis was performed using the standard protocol following bolus administration of intravenous contrast. CONTRAST:  125mL ISOVUE-300 IOPAMIDOL (ISOVUE-300) INJECTION 61% COMPARISON:  None. FINDINGS: LOWER CHEST: Lung bases are clear. Included heart size is normal. No pericardial effusion. HEPATOBILIARY: Status post cholecystectomy.  Normal liver. PANCREAS: Normal. SPLEEN: Normal. ADRENALS/URINARY TRACT: Kidneys are orthotopic, demonstrating symmetric enhancement. Multifocal LEFT cortical scarring with mildly atrophic LEFT kidney. Punctate LEFT upper pole nephrolithiasis. Multiple small RIGHT renal  cysts measuring to 11 mm. No hydronephrosis. STOMACH/BOWEL: The stomach, small and large bowel are normal in course and caliber without inflammatory changes. Normal appendix. VASCULAR/LYMPHATIC: Aortoiliac vessels are normal in course and caliber. No lymphadenopathy by CT size criteria. REPRODUCTIVE: Thickened appearing cervix. IUD in central uterus. 2.3 cm dominant RIGHT adnexal follicle. OTHER: No intraperitoneal free fluid or free air. MUSCULOSKELETAL: Nonacute. Sacroiliac sclerosis and mild articular irregularity most compatible with sacroiliitis. Small L5-S1 broad-based disc osteophyte complex. IMPRESSION: Thickened cervix which could reflect redundant tissue or cervicitis. Atrophic, scarred LEFT kidney. Punctate nonobstructing LEFT upper pole nephrolithiasis. Electronically Signed   By:  Elon Alas M.D.   On: 03/13/2017 00:12    ____________________________________________    Procedures   ____________________________________________   INITIAL IMPRESSION / ASSESSMENT AND PLAN / ED COURSE  Pertinent labs & imaging results that were available during my care of the patient were reviewed by me and considered in my medical decision making (see chart for details).  Patient states that she saw her OB/GYN last week and at which point the IUD strings were not visualized. Patient states that her IUD scheduled to be removed next week. CT scan findings concerning for possible cervicitis which is confirmed on pelvic exam. Patient given cephalexin 250 mg IM and will be prescribed doxycycline for home. Patient discussed with Dr. Prentice Docker OB/GYN on call for her noted clinic who agreed with management thus far and will see the patient in the outpatient setting and follow-up.      ____________________________________________  FINAL CLINICAL IMPRESSION(S) / ED DIAGNOSES  Final diagnoses:  Cervicitis     MEDICATIONS GIVEN DURING THIS VISIT:  Medications  morphine 2 MG/ML injection 2 mg (2 mg Intravenous Given 03/12/17 2325)  ondansetron (ZOFRAN) injection 4 mg (4 mg Intravenous Given 03/12/17 2325)  iopamidol (ISOVUE-300) 61 % injection 100 mL (100 mLs Intravenous Contrast Given 03/12/17 2330)  cefTRIAXone (ROCEPHIN) injection 250 mg (250 mg Intramuscular Given 03/13/17 0122)     NEW OUTPATIENT MEDICATIONS STARTED DURING THIS VISIT:  Discharge Medication List as of 03/13/2017  1:44 AM    START taking these medications   Details  doxycycline (VIBRAMYCIN) 50 MG capsule Take 2 capsules (100 mg total) by mouth 2 (two) times daily., Starting Wed 03/13/2017, Until Sat 03/23/2017, Print        Discharge Medication List as of 03/13/2017  1:44 AM      Discharge Medication List as of 03/13/2017  1:44 AM       Note:  This document was prepared using Dragon voice  recognition software and may include unintentional dictation errors.    Gregor Hams, MD 03/13/17 [phone]

## 2017-03-12 NOTE — ED Triage Notes (Signed)
Pt states she is having severe lower abd pain that started one hour ago - pt reports nausea - she reports urinary urgency that started this afternoon on her way home - denies any other symptoms

## 2017-03-13 LAB — WET PREP, GENITAL
CLUE CELLS WET PREP: NONE SEEN
Sperm: NONE SEEN
Trich, Wet Prep: NONE SEEN
YEAST WET PREP: NONE SEEN

## 2017-03-13 LAB — CHLAMYDIA/NGC RT PCR (ARMC ONLY)
Chlamydia Tr: NOT DETECTED
N GONORRHOEAE: NOT DETECTED

## 2017-03-13 MED ORDER — DOXYCYCLINE HYCLATE 50 MG PO CAPS: 100.0000 mg | ORAL_CAPSULE | Freq: Two times a day (BID) | ORAL | 0 refills | Status: AC

## 2017-03-13 MED ORDER — CEFTRIAXONE SODIUM 250 MG IJ SOLR
250.0000 mg | Freq: Once | INTRAMUSCULAR | Status: AC
  Administered 2017-03-13: 250 mg via INTRAMUSCULAR
  Filled 2017-03-13: qty 250

## 2017-03-13 NOTE — ED Notes (Signed)

## 2017-03-25 ENCOUNTER — Ambulatory Visit (INDEPENDENT_AMBULATORY_CARE_PROVIDER_SITE_OTHER): Payer: 59 | Admitting: Obstetrics and Gynecology

## 2017-03-25 ENCOUNTER — Encounter: Payer: Self-pay | Admitting: Obstetrics and Gynecology

## 2017-03-25 VITALS — BP 118/74 | Ht 64.0 in | Wt 157.0 lb

## 2017-03-25 DIAGNOSIS — Z30433 Encounter for removal and reinsertion of intrauterine contraceptive device: Secondary | ICD-10-CM

## 2017-03-28 DIAGNOSIS — Z30433 Encounter for removal and reinsertion of intrauterine contraceptive device: Secondary | ICD-10-CM | POA: Insufficient documentation

## 2017-03-28 NOTE — Progress Notes (Signed)
Obstetrics & Gynecology Office Visit   Chief Complaint  Patient presents with  . Contraception    IUD removal   History of Present Illness: 41 y.o. G3P3 female who desires to have her IUD removed and replaced. She was recently seen in the ER with abdominal pain and was diagnosed with cervicitis.  Her labs returned as negative for this.  She had a CT scan which showed no other cause. Her symptoms have resolved. She was treated empirically for PID, though she has very low risk of this. Her IUD strings have not been visible at recent visits. Her CT scan on 6/12 showed the IUD in the uterus in the correct location. She would like to have the IUD removed and replaced, if possible.  Of note, her uterus was anteflexed on CT scan.  Past Medical History:  Diagnosis Date  . Allergy   . GERD (gastroesophageal reflux disease)   . Migraine   . Migraines     Past Surgical History:  Procedure Laterality Date  . CHOLECYSTECTOMY N/A 02/25/2015   Procedure: LAPAROSCOPIC CHOLECYSTECTOMY WITH INTRAOPERATIVE CHOLANGIOGRAM;  Surgeon: Christene Lye, MD;  Location: [facility] ORS;  Service: General;  Laterality: N/A;  . SHOULDER ARTHROSCOPY Left 11/17/2015   Procedure: ARTHROSCOPY SHOULDER acrominal repair, decompression left ;  Surgeon: Corky Mull, MD;  Location: [facility] ORS;  Service: Orthopedics;  Laterality: Left;    Gynecologic History: No LMP recorded. Patient is not currently having periods (Reason: IUD).  Obstetric History: G3P3  Family History  Problem Relation Age of Onset  . Cancer Mother 67       colon  . Diabetes Father     Social History   Social History  . Marital status: Married    Spouse name: N/A  . Number of children: N/A  . Years of education: N/A   Occupational History  . Not on file.   Social History Main Topics  . Smoking status: Never Smoker  . Smokeless tobacco: Never Used  . Alcohol use 0.0 oz/week     Comment: 1/day  . Drug use: No  . Sexual activity: Yes   Birth control/ protection: IUD   Other Topics Concern  . Not on file   Social History Narrative  . No narrative on file    Allergies  Allergen Reactions  . Mupirocin Calcium   . Bacitracin Rash      Medication Sig Start Date End Date Taking? Authorizing Provider  albuterol (PROVENTIL HFA;VENTOLIN HFA) 108 (90 Base) MCG/ACT inhaler Inhale 2 puffs into the lungs every 6 (six) hours as needed for wheezing or shortness of breath. 01/26/16  Yes Roselee Nova, MD  Cholecalciferol (VITAMIN D PO) Take 4,000 mg by mouth daily.   Yes [provider]  ibuprofen (ADVIL,MOTRIN) 800 MG tablet Take 800 mg by mouth every 8 (eight) hours as needed.   Yes [provider]  Lactobacillus (ACIDOPHILUS PO) Take by mouth.   Yes [provider]  levonorgestrel (MIRENA) 20 MCG/24HR IUD 1 each by Intrauterine route once.   Yes [provider]  meclizine (ANTIVERT) 25 MG tablet Take 25 mg by mouth 3 (three) times daily as needed for dizziness.   Yes [provider]  topiramate (TOPAMAX) 200 MG tablet Take 200 mg by mouth daily.   Yes [provider]  vitamin B-12 (CYANOCOBALAMIN) 1000 MCG tablet Take 1,000 mcg by mouth daily.   Yes [provider]    Review of Systems  Constitutional: Negative.  HENT: Negative.   Eyes: Negative.   Respiratory: Negative.   Cardiovascular: Negative.   Gastrointestinal: Negative.   Genitourinary: Negative.   Musculoskeletal: Negative.   Skin: Negative.   Neurological: Negative.   Psychiatric/Behavioral: Negative.      Physical Exam BP 118/74   Ht 5\' 4"  (1.626 m)   Wt 157 lb (71.2 kg)   BMI 26.95 kg/m  No LMP recorded. Patient is not currently having periods (Reason: IUD). Physical Exam  Constitutional: She is oriented to person, place, and time. She appears well-developed and well-nourished. No distress.  Genitourinary: Vagina normal and uterus normal. Pelvic exam was performed with patient supine.  There is no rash, tenderness, lesion or injury on the right labia. There is no rash, tenderness, lesion or injury on the left labia. Vagina exhibits no lesion. No erythema or bleeding in the vagina. No signs of injury around the vagina. Right adnexum does not display mass, does not display tenderness and does not display fullness. Left adnexum does not display mass, does not display tenderness and does not display fullness. Cervix does not exhibit motion tenderness, discharge, polyp or visible IUD strings.   Uterus is mobile. Uterus is not enlarged, tender, exhibiting a mass or irregular (is regular).  Genitourinary Comments: IUD strings were teased out of the cervix using a cytobrush.  She note for removal details.  Her uterus was anteflexed on exam.   Eyes: EOM are normal. No scleral icterus.  Abdominal: Soft. Bowel sounds are normal. She exhibits no distension and no mass. There is no tenderness. There is no rebound and no guarding.  Musculoskeletal: Normal range of motion. She exhibits no edema.  Neurological: She is alert and oriented to person, place, and time. No cranial nerve deficit.  Skin: Skin is warm and dry. No erythema.  Psychiatric: She has a normal mood and affect. Her behavior is normal. Judgment normal.   IUD Removal  Patient identified, informed consent performed, consent signed.  Patient was in the dorsal lithotomy position, normal external genitalia was noted.  A speculum was placed in the patient's vagina, normal discharge was noted, no lesions. The cervix was visualized, no lesions, no abnormal discharge.  The strings of the IUD were eventually able to be teased out of the cervix, then grasped and pulled using ring forceps. The IUD was removed in its entirety. Patient tolerated the procedure well.    IUD Insertion Procedure Note Patient identified, informed consent performed, consent signed.   Discussed risks of irregular bleeding, cramping, infection, malpositioning, expulsion  or uterine perforation of the IUD (1:1000 placements)  which may require further procedure such as laparoscopy.  IUD while effective at preventing pregnancy do not prevent transmission of sexually transmitted diseases and use of barrier methods for this purpose was discussed. Time out was performed.  Urine pregnancy test negative.  Speculum alrady placed in the vagina.  Cervix visualized.  Cleaned with Betadine x 2.  Grasped anteriorly with a single tooth tenaculum.  IUD unable to pass beyond 4-5 cm.  Attempted to slightly dilate the internal os using cervical dilators which revealed the uterus to be anteflexed. Several attempts made to angle the IUD in that direction. However, I was unable to safely place the IUD and abandoned the effort in order to not cause a uterine perforation.  Tenaculum was removed, good hemostasis noted.  Patient tolerated procedure well.   Female chaperone present for pelvic and breast  portions of the physical exam  Assessment: 41 y.o. G3P3 female No  problem-specific Assessment & Plan notes found for this encounter.   Plan: Problem List Items Addressed This Visit    None    Visit Diagnoses    Encounter for removal and reinsertion of IUD    -  Primary   Relevant Orders   US Pelvis Complete    Unable to insert new IUD safely given sharp angle of uterine anteflexion.  Discussed with patient that safest route would be to place under ultrasound guidance. Will arrange for soon. Recommend back-up protection in the mean time.   Prentice Docker, MD 03/28/2017 9:35 AM

## 2017-04-09 ENCOUNTER — Other Ambulatory Visit: Payer: 59

## 2017-04-11 ENCOUNTER — Emergency Department
Admission: EM | Admit: 2017-04-11 | Discharge: 2017-04-11 | Disposition: A | Discharge status: Home / Self Care | Payer: 59 | Attending: Emergency Medicine | Admitting: Emergency Medicine

## 2017-04-11 ENCOUNTER — Encounter: Payer: Self-pay | Admitting: Emergency Medicine

## 2017-04-11 ENCOUNTER — Emergency Department: Payer: 59

## 2017-04-11 DIAGNOSIS — N200 Calculus of kidney: Secondary | ICD-10-CM

## 2017-04-11 DIAGNOSIS — R1032 Left lower quadrant pain: Secondary | ICD-10-CM | POA: Diagnosis present

## 2017-04-11 DIAGNOSIS — Z79899 Other long term (current) drug therapy: Secondary | ICD-10-CM | POA: Diagnosis not present

## 2017-04-11 LAB — CBC WITH DIFFERENTIAL/PLATELET
Basophils Absolute: 0 10*3/uL (ref 0–0.1)
Basophils Relative: 1 %
EOS PCT: 1 %
Eosinophils Absolute: 0.1 10*3/uL (ref 0–0.7)
HEMATOCRIT: 42.9 % (ref 35.0–47.0)
HEMOGLOBIN: 14.9 g/dL (ref 12.0–16.0)
LYMPHS ABS: 1.3 10*3/uL (ref 1.0–3.6)
LYMPHS PCT: 16 %
MCH: 30.9 pg (ref 26.0–34.0)
MCHC: 34.7 g/dL (ref 32.0–36.0)
MCV: 89 fL (ref 80.0–100.0)
Monocytes Absolute: 0.4 10*3/uL (ref 0.2–0.9)
Monocytes Relative: 5 %
NEUTROS ABS: 6.5 10*3/uL (ref 1.4–6.5)
Neutrophils Relative %: 77 %
PLATELETS: 201 10*3/uL (ref 150–440)
RBC: 4.83 MIL/uL (ref 3.80–5.20)
RDW: 13.3 % (ref 11.5–14.5)
WBC: 8.4 10*3/uL (ref 3.6–11.0)

## 2017-04-11 LAB — COMPREHENSIVE METABOLIC PANEL
ALT: 20 U/L (ref 14–54)
ANION GAP: 7 (ref 5–15)
AST: 20 U/L (ref 15–41)
Albumin: 4.2 g/dL (ref 3.5–5.0)
Alkaline Phosphatase: 46 U/L (ref 38–126)
BUN: 19 mg/dL (ref 6–20)
CO2: 20 mmol/L — ABNORMAL LOW (ref 22–32)
Calcium: 9.2 mg/dL (ref 8.9–10.3)
Chloride: 110 mmol/L (ref 101–111)
Creatinine, Ser: 1.16 mg/dL — ABNORMAL HIGH (ref 0.44–1.00)
GFR calc Af Amer: >60 mL/min (ref >60)
GFR, EST NON AFRICAN AMERICAN: 58 mL/min — AB (ref >60)
Glucose, Bld: 104 mg/dL — ABNORMAL HIGH (ref 65–99)
POTASSIUM: 4.2 mmol/L (ref 3.5–5.1)
Sodium: 137 mmol/L (ref 135–145)
Total Bilirubin: 0.6 mg/dL (ref 0.3–1.2)
Total Protein: 7.6 g/dL (ref 6.5–8.1)

## 2017-04-11 LAB — URINALYSIS, COMPLETE (UACMP) WITH MICROSCOPIC
Bilirubin Urine: NEGATIVE
Glucose, UA: NEGATIVE mg/dL
Ketones, ur: NEGATIVE mg/dL
Nitrite: NEGATIVE
PROTEIN: NEGATIVE mg/dL
Specific Gravity, Urine: 1.017 (ref 1.005–1.030)
pH: 5 (ref 5.0–8.0)

## 2017-04-11 LAB — POCT PREGNANCY, URINE: PREG TEST UR: NEGATIVE

## 2017-04-11 MED ORDER — FENTANYL CITRATE (PF) 100 MCG/2ML IJ SOLN
50.0000 ug | Freq: Once | INTRAMUSCULAR | Status: AC
  Administered 2017-04-11: 50 ug via INTRAVENOUS
  Filled 2017-04-11: qty 2

## 2017-04-11 MED ORDER — ONDANSETRON HCL 4 MG/2ML IJ SOLN
4.0000 mg | Freq: Once | INTRAMUSCULAR | Status: AC
  Administered 2017-04-11: 4 mg via INTRAVENOUS
  Filled 2017-04-11: qty 2

## 2017-04-11 MED ORDER — IBUPROFEN 600 MG PO TABS: 600.0000 mg | ORAL_TABLET | Freq: Three times a day (TID) | ORAL | 0 refills | Status: AC | PRN

## 2017-04-11 MED ORDER — KETOROLAC TROMETHAMINE 30 MG/ML IJ SOLN
15.0000 mg | Freq: Once | INTRAMUSCULAR | Status: AC
  Administered 2017-04-11: 15 mg via INTRAVENOUS
  Filled 2017-04-11: qty 1

## 2017-04-11 MED ORDER — HYDROCODONE-ACETAMINOPHEN 5-325 MG PO TABS: 1.0000 | ORAL_TABLET | Freq: Four times a day (QID) | ORAL | 0 refills | Status: DC | PRN

## 2017-04-11 NOTE — ED Triage Notes (Signed)
Pt reports left side severe flank pain that began thirty minutes prior to arrival. Pt reports has been told she has a kidney stone on the left side but it was high up. Pt reports nausea. Pt denies dysuria.

## 2017-04-11 NOTE — Discharge Instructions (Signed)
Please take your pain medication only as needed and follow up with your primary care physician for any concerns. Return to the emergency department if your pain is not under control, for fevers, for chills, or for any other issues.  It was a pleasure to take care of you today, and thank you for coming to our emergency department.  If you have any questions or concerns before leaving please ask the nurse to grab me and I'm more than happy to go through your aftercare instructions again.  If you were prescribed any opioid pain medication today such as Norco, Vicodin, Percocet, morphine, hydrocodone, or oxycodone please make sure you do not drive when you are taking this medication as it can alter your ability to drive safely.  If you have any concerns once you are home that you are not improving or are in fact getting worse before you can make it to your follow-up appointment, please do not hesitate to call [phone] and come back for further evaluation.  Darel Hong MD  Results for orders placed or performed during the hospital encounter of 04/11/17  Urinalysis, Complete w Microscopic  Result Value Ref Range   Color, Urine YELLOW (A) YELLOW   APPearance HAZY (A) CLEAR   Specific Gravity, Urine 1.017 1.005 - 1.030   pH 5.0 5.0 - 8.0   Glucose, UA NEGATIVE NEGATIVE mg/dL   Hgb urine dipstick LARGE (A) NEGATIVE   Bilirubin Urine NEGATIVE NEGATIVE   Ketones, ur NEGATIVE NEGATIVE mg/dL   Protein, ur NEGATIVE NEGATIVE mg/dL   Nitrite NEGATIVE NEGATIVE   Leukocytes, UA MODERATE (A) NEGATIVE   RBC / HPF TOO NUMEROUS TO COUNT 0 - 5 RBC/hpf   WBC, UA 0-5 0 - 5 WBC/hpf   Bacteria, UA MANY (A) NONE SEEN   Squamous Epithelial / LPF 0-5 (A) NONE SEEN   Mucous PRESENT   Comprehensive metabolic panel  Result Value Ref Range   Sodium 137 135 - 145 mmol/L   Potassium 4.2 3.5 - 5.1 mmol/L   Chloride 110 101 - 111 mmol/L   CO2 20 (L) 22 - 32 mmol/L   Glucose, Bld 104 (H) 65 - 99 mg/dL   BUN 19 6 - 20  mg/dL   Creatinine, Ser 1.16 (H) 0.44 - 1.00 mg/dL   Calcium 9.2 8.9 - 10.3 mg/dL   Total Protein 7.6 6.5 - 8.1 g/dL   Albumin 4.2 3.5 - 5.0 g/dL   AST 20 15 - 41 U/L   ALT 20 14 - 54 U/L   Alkaline Phosphatase 46 38 - 126 U/L   Total Bilirubin 0.6 0.3 - 1.2 mg/dL   GFR calc non Af Amer 58 (L) >60 mL/min   GFR calc Af Amer >60 >60 mL/min   Anion gap 7 5 - 15  CBC with Differential  Result Value Ref Range   WBC 8.4 3.6 - 11.0 K/uL   RBC 4.83 3.80 - 5.20 MIL/uL   Hemoglobin 14.9 12.0 - 16.0 g/dL   HCT 42.9 35.0 - 47.0 %   MCV 89.0 80.0 - 100.0 fL   MCH 30.9 26.0 - 34.0 pg   MCHC 34.7 32.0 - 36.0 g/dL   RDW 13.3 11.5 - 14.5 %   Platelets 201 150 - 440 K/uL   Neutrophils Relative % 77 %   Neutro Abs 6.5 1.4 - 6.5 K/uL   Lymphocytes Relative 16 %   Lymphs Abs 1.3 1.0 - 3.6 K/uL   Monocytes Relative 5 %   Monocytes  Absolute 0.4 0.2 - 0.9 K/uL   Eosinophils Relative 1 %   Eosinophils Absolute 0.1 0 - 0.7 K/uL   Basophils Relative 1 %   Basophils Absolute 0.0 0 - 0.1 K/uL  Pregnancy, urine POC  Result Value Ref Range   Preg Test, Ur NEGATIVE NEGATIVE   Ct Abdomen Pelvis W Contrast  Result Date: 03/13/2017 CLINICAL DATA:  Severe lower abdominal pain for 1 hour, nausea and urinary urgency. EXAM: CT ABDOMEN AND PELVIS WITH CONTRAST TECHNIQUE: Multidetector CT imaging of the abdomen and pelvis was performed using the standard protocol following bolus administration of intravenous contrast. CONTRAST:  143mL ISOVUE-300 IOPAMIDOL (ISOVUE-300) INJECTION 61% COMPARISON:  None. FINDINGS: LOWER CHEST: Lung bases are clear. Included heart size is normal. No pericardial effusion. HEPATOBILIARY: Status post cholecystectomy.  Normal liver. PANCREAS: Normal. SPLEEN: Normal. ADRENALS/URINARY TRACT: Kidneys are orthotopic, demonstrating symmetric enhancement. Multifocal LEFT cortical scarring with mildly atrophic LEFT kidney. Punctate LEFT upper pole nephrolithiasis. Multiple small RIGHT renal cysts  measuring to 11 mm. No hydronephrosis. STOMACH/BOWEL: The stomach, small and large bowel are normal in course and caliber without inflammatory changes. Normal appendix. VASCULAR/LYMPHATIC: Aortoiliac vessels are normal in course and caliber. No lymphadenopathy by CT size criteria. REPRODUCTIVE: Thickened appearing cervix. IUD in central uterus. 2.3 cm dominant RIGHT adnexal follicle. OTHER: No intraperitoneal free fluid or free air. MUSCULOSKELETAL: Nonacute. Sacroiliac sclerosis and mild articular irregularity most compatible with sacroiliitis. Small L5-S1 broad-based disc osteophyte complex. IMPRESSION: Thickened cervix which could reflect redundant tissue or cervicitis. Atrophic, scarred LEFT kidney. Punctate nonobstructing LEFT upper pole nephrolithiasis. Electronically Signed   By: Elon Alas M.D.   On: 03/13/2017 00:12   Ct Renal Stone Study  Result Date: 04/11/2017 CLINICAL DATA:  Left flank pain for over 2 hours EXAM: CT ABDOMEN AND PELVIS WITHOUT CONTRAST TECHNIQUE: Multidetector CT imaging of the abdomen and pelvis was performed following the standard protocol without IV contrast. COMPARISON:  03/12/2017 FINDINGS: Lower chest:  No acute finding Hepatobiliary: No focal liver abnormality.Cholecystectomy. Pancreas: Unremarkable. Spleen: Unremarkable. Adrenals/Urinary Tract: Negative adrenals. Extensive scarring of the left kidney. New mild left hydronephrosis. There is a tiny stone in the bladder only seen on reformats. Question of this was recently passed from the left. Punctate left nephrolithiasis. Small low-density in the lower pole right kidney which was cystic on previous CT. Unremarkable bladder. Stomach/Bowel: No obstruction. No appendicitis. High-density in the duodenum is likely a pill. Vascular/Lymphatic: No acute vascular abnormality. No mass or adenopathy. Reproductive:An IUD has been removed. Dominant follicle now seen on the left. Other: Trace pelvic fluid.  No pneumoperitoneum  Musculoskeletal: No acute abnormalities.  Osteitis condensans iliac. IMPRESSION: 1. Mild left hydronephrosis without visible obstructive process. 2. Tiny stone in the urinary bladder, question recent passage from the left. 3. Extensive left renal scarring and punctate upper pole nephrolithiasis. Electronically Signed   By: Monte Fantasia M.D.   On: 04/11/2017 09:32

## 2017-04-11 NOTE — ED Notes (Signed)
Pt ambulatory with a steady gait , husband at bedside to transport pt home safely , no pain , no distress noted

## 2017-04-11 NOTE — ED Provider Notes (Signed)
[facility] Emergency Department Provider Note  ____________________________________________   First MD Initiated Contact with Patient 04/11/17 0902     (approximate)  I have reviewed the triage vital signs and the nursing notes.   HISTORY  Chief Complaint Flank Pain    HPI Jenna White is a 41 y.o. female who self presents to the emergency Department with roughly 30 minutes of severe sharp left flank pain radiating around her abdomen towards her groin. She denies hematuria or dysuria. She's never had a kidney stone before. She is not currently menstruating. She reports nausea but no vomiting. Her pain is improving on its own but comes in intermittent waves. She has a remote history of cholecystectomy.   Past Medical History:  Diagnosis Date  . Allergy   . GERD (gastroesophageal reflux disease)   . Migraine   . Migraines     Patient Active Problem List   Diagnosis Date Noted  . Encounter for IUD removal and reinsertion 03/28/2017  . Hypertriglyceridemia 02/13/2017  . Well woman exam without gynecological exam 01/24/2016  . Shortness of breath 01/24/2016  . Acute sinusitis 09/13/2015  . Elevated TSH 05/25/2015  . Post-nasal drainage 05/25/2015  . Headache, migraine 05/25/2015  . Migraine variant 01/13/2015  . Headache, tension-type 05/06/2012    Past Surgical History:  Procedure Laterality Date  . CHOLECYSTECTOMY N/A 02/25/2015   Procedure: LAPAROSCOPIC CHOLECYSTECTOMY WITH INTRAOPERATIVE CHOLANGIOGRAM;  Surgeon: Christene Lye, MD;  Location: [facility] ORS;  Service: General;  Laterality: N/A;  . SHOULDER ARTHROSCOPY Left 11/17/2015   Procedure: ARTHROSCOPY SHOULDER acrominal repair, decompression left ;  Surgeon: Corky Mull, MD;  Location: [facility] ORS;  Service: Orthopedics;  Laterality: Left;    Prior to Admission medications   Medication Sig Start Date End Date Taking? Authorizing Provider  Cholecalciferol (VITAMIN D PO) Take  4,000 mg by mouth daily.   Yes [provider]  Lactobacillus (ACIDOPHILUS PO) Take by mouth.   Yes [provider]  Melatonin 5 MG TABS Take 5 mg by mouth daily.   Yes [provider]  topiramate (TOPAMAX) 100 MG tablet Take 100 mg by mouth 2 (two) times daily.    Yes [provider]  vitamin B-12 (CYANOCOBALAMIN) 1000 MCG tablet Take 1,000 mcg by mouth daily.   Yes [provider]  albuterol (PROVENTIL HFA;VENTOLIN HFA) 108 (90 Base) MCG/ACT inhaler Inhale 2 puffs into the lungs every 6 (six) hours as needed for wheezing or shortness of breath. 01/26/16   Roselee Nova, MD  Cetirizine HCl (ZYRTEC ALLERGY PO) Take 1 tablet by mouth daily as needed.    [provider]  HYDROcodone-acetaminophen (NORCO) 5-325 MG tablet Take 1 tablet by mouth every 6 (six) hours as needed for severe pain. 04/11/17   Darel Hong, MD  ibuprofen (ADVIL,MOTRIN) 600 MG tablet Take 1 tablet (600 mg total) by mouth every 8 (eight) hours as needed. 04/11/17   Darel Hong, MD  meclizine (ANTIVERT) 25 MG tablet Take 25 mg by mouth 3 (three) times daily as needed for dizziness.    [provider]  Olopatadine HCl 0.2 % SOLN  04/10/17   [provider]    Allergies Mupirocin calcium and Bacitracin  Family History  Problem Relation Age of Onset  . Cancer Mother 46       colon  . Diabetes Father     Social History Social History  Substance Use Topics  . Smoking status: Never Smoker  . Smokeless tobacco:  Never Used  . Alcohol use 0.0 oz/week     Comment: 1/day    Review of Systems Constitutional: No fever/chills Eyes: No visual changes. ENT: No sore throat. Cardiovascular: Denies chest pain. Respiratory: Denies shortness of breath. Gastrointestinal: Positive abdominal pain.  Positive nausea, no vomiting.  No diarrhea.  No constipation. Genitourinary: Negative for dysuria. Musculoskeletal: Positive for back pain. Skin: Negative for  rash. Neurological: Negative for headaches, focal weakness or numbness.   ____________________________________________   PHYSICAL EXAM:  VITAL SIGNS: ED Triage Vitals  Enc Vitals Group     BP 04/11/17 0751 130/81     Pulse Rate 04/11/17 0751 78     Resp 04/11/17 0751 18     Temp 04/11/17 0751 98.2 F (36.8 C)     Temp Source 04/11/17 0751 Oral     SpO2 04/11/17 0751 100 %     Weight 04/11/17 0752 150 lb (68 kg)     Height 04/11/17 0752 5\' 4"  (1.626 m)     Head Circumference --      Peak Flow --      Pain Score 04/11/17 0754 10     Pain Loc --      Pain Edu? --      Excl. in [place]? --     Constitutional: Alert and oriented 4 appears quite uncomfortable nontoxic no diaphoresis speaks in full clear sentences Eyes: PERRL EOMI. Head: Atraumatic. Nose: No congestion/rhinnorhea. Mouth/Throat: No trismus Neck: No stridor.   Cardiovascular: Normal rate, regular rhythm. Grossly normal heart sounds.  Good peripheral circulation. Respiratory: Normal respiratory effort.  No retractions. Lungs CTAB and moving good air Gastrointestinal: Soft nondistended nontender no rebound or guarding no peritonitis she does have mild costovertebral tenderness on the left Musculoskeletal: No lower extremity edema   Neurologic:  Normal speech and language. No gross focal neurologic deficits are appreciated. Skin:  Skin is warm, dry and intact. No rash noted. Psychiatric: Mood and affect are normal. Speech and behavior are normal.    ____________________________________________   DIFFERENTIAL includes but not limited to  Renal colic, UTI, appendicitis, diverticulitis, pyelonephritis ____________________________________________   LABS (all labs ordered are listed, but only abnormal results are displayed)  Labs Reviewed  URINALYSIS, COMPLETE (UACMP) WITH MICROSCOPIC - Abnormal; Notable for the following:       Result Value   Color, Urine YELLOW (*)    APPearance HAZY (*)    Hgb urine dipstick  LARGE (*)    Leukocytes, UA MODERATE (*)    Bacteria, UA MANY (*)    Squamous Epithelial / LPF 0-5 (*)    All other components within normal limits  COMPREHENSIVE METABOLIC PANEL - Abnormal; Notable for the following:    CO2 20 (*)    Glucose, Bld 104 (*)    Creatinine, Ser 1.16 (*)    GFR calc non Af Amer 58 (*)    All other components within normal limits  CBC WITH DIFFERENTIAL/PLATELET  POCT PREGNANCY, URINE    Hematuria with no evidence of infection __________________________________________  EKG   ____________________________________________  RADIOLOGY  CT scan shows hydroureter on the left and a stone in the bladder likely recently passed ____________________________________________   PROCEDURES  Procedure(s) performed: no  Procedures  Critical Care performed: no  Observation: no ____________________________________________   INITIAL IMPRESSION / ASSESSMENT AND PLAN / ED COURSE  Pertinent labs & imaging results that were available during my care of the patient were reviewed by me and considered in my medical decision making (see  chart for details).  The patient arrives uncomfortable appearing with a history concerning for left-sided renal colic. While she does have leuks in her urine she does not have white blood cells. She has no clinical symptoms of pyelonephritis. CT scan confirms mild hydroureter on the left and a punctate stone that is now in the bladder. This likely represents stone which is passed. I will treat her with a short course of pain medication and primary care follow-up.      ____________________________________________   FINAL CLINICAL IMPRESSION(S) / ED DIAGNOSES  Final diagnoses:  Kidney stone      NEW MEDICATIONS STARTED DURING THIS VISIT:  Discharge Medication List as of 04/11/2017 10:09 AM    START taking these medications   Details  HYDROcodone-acetaminophen (NORCO) 5-325 MG tablet Take 1 tablet by mouth every 6 (six)  hours as needed for severe pain., Starting Thu 04/11/2017, Print         Note:  This document was prepared using Dragon voice recognition software and may include unintentional dictation errors.     Darel Hong, MD 04/11/17 [phone]

## 2017-04-11 NOTE — ED Notes (Signed)
Patient transported to CT 

## 2017-04-16 ENCOUNTER — Ambulatory Visit: Payer: 59

## 2017-04-16 ENCOUNTER — Ambulatory Visit (INDEPENDENT_AMBULATORY_CARE_PROVIDER_SITE_OTHER): Payer: 59 | Admitting: Obstetrics and Gynecology

## 2017-04-16 DIAGNOSIS — Z30430 Encounter for insertion of intrauterine contraceptive device: Secondary | ICD-10-CM

## 2017-04-16 DIAGNOSIS — Z30433 Encounter for removal and reinsertion of intrauterine contraceptive device: Secondary | ICD-10-CM

## 2017-04-16 MED ORDER — LEVONORGESTREL 20 MCG/24HR IU IUD: 1.0000 | INTRAUTERINE_SYSTEM | Freq: Once | INTRAUTERINE | 0 refills | Status: AC

## 2017-04-16 NOTE — Progress Notes (Signed)
    IUD Insertion Procedure Note Patient identified, informed consent performed, consent signed.   Discussed risks of irregular bleeding, cramping, infection, malpositioning, expulsion or uterine perforation of the IUD (1:1000 placements)  which may require further procedure such as laparoscopy.  IUD while effective at preventing pregnancy do not prevent transmission of sexually transmitted diseases and use of barrier methods for this purpose was discussed. Time out was performed.  Urine pregnancy test negative.  Speculum placed in the vagina.  Cervix visualized.  Cleaned with Betadine x 2.  Grasped anteriorly with a single tooth tenaculum.  Under ultrasound guidance, cervix serially dilated to a Pratt 17.  IUD placed per manufacturer's recommendations while directly visualized by ultrasound.  Strings trimmed to 3 cm. Tenaculum was removed, good hemostasis noted.  Patient tolerated procedure well.  Ultrasound verification that IUD is in the uterus and in the correct location after placement.  Patient was given post-procedure instructions.  She was advised to have backup contraception for one week.  Patient was also asked to check IUD strings periodically and follow up in 4 weeks for IUD check.  Prentice Docker, MD 04/16/2017 1:01 PM

## 2017-05-14 ENCOUNTER — Telehealth: Payer: Self-pay

## 2017-05-14 NOTE — Telephone Encounter (Signed)
Pt had IUD inserted c diff c u/s guidance.  Has had a lot of bleeding o occ but since last Thurs it's been nonstop heavy flow which is unusual for her.  Today, she felt like she was going to pass out and dizzy.  Has appt tomorrow.  Should she come in today instead?  She usually has no period c mirena.  [phone]

## 2017-05-15 ENCOUNTER — Ambulatory Visit (INDEPENDENT_AMBULATORY_CARE_PROVIDER_SITE_OTHER): Payer: 59 | Admitting: Obstetrics and Gynecology

## 2017-05-15 VITALS — BP 118/78 | Ht 64.0 in | Wt 154.0 lb

## 2017-05-15 DIAGNOSIS — N83209 Unspecified ovarian cyst, unspecified side: Secondary | ICD-10-CM | POA: Diagnosis not present

## 2017-05-15 DIAGNOSIS — Z30431 Encounter for routine checking of intrauterine contraceptive device: Secondary | ICD-10-CM | POA: Diagnosis not present

## 2017-05-15 NOTE — Progress Notes (Signed)
     IUD String Check  Subjctive: [name] presents for IUD string check.  She had a Mirena placed 4 weeks ago.  Since placement of her IUD she had episodes of vaginal bleeding. Initially the bleeding was spotting. Last week it was heavy and continuous and stopped yesterday.   She denies cramping or discomfort.  She has had intercourse since placement.  She has not checked the strings.  She denies any fever, chills, nausea, vomiting, or other complaints.    Objective: BP 118/78   Ht 5\' 4"  (1.626 m)   Wt 154 lb (69.9 kg)   BMI 26.43 kg/m  Physical Exam  Constitutional: She is oriented to person, place, and time. She appears well-developed and well-nourished. No distress.  HENT:  Head: Normocephalic and atraumatic.  Nose: Nose normal.  Eyes: Conjunctivae are normal.  Neck: Normal range of motion.  Cardiovascular: Normal rate, regular rhythm and normal heart sounds.   Pulmonary/Chest: Effort normal and breath sounds normal.  Abdominal: Soft. She exhibits no distension. There is no tenderness. There is no rebound and no guarding.  Genitourinary: Uterus normal. Pelvic exam was performed with patient supine. There is no lesion on the right labia. There is no lesion on the left labia. Uterus is not tender. Cervix exhibits no motion tenderness and no discharge. Right adnexum displays no mass. Left adnexum displays no mass. No vaginal discharge found.  Genitourinary Comments: IUD string visualized at 3cm length and not trimmed.  Musculoskeletal: Normal range of motion.  Lymphadenopathy:       Right: No inguinal adenopathy present.       Left: No inguinal adenopathy present.  Neurological: She is alert and oriented to person, place, and time.  Skin: Skin is warm and dry. No rash noted.  Psychiatric: She has a normal mood and affect. Her behavior is normal. Judgment normal.   Female chaperone was present for the entirety of the pelvic exam  Assessment: 41 y.o. year old female  status post prior Mirena IUD placement 4 week ago, doing well.  Plan: 1.  The patient was given instructions to check her IUD strings monthly and call with any problems or concerns.  She should call for fevers, chills, abnormal vaginal discharge, pelvic pain, or other complaints. 2.  She will return for a follow up ultrasound in 4-8 weeks given her ovarian cyst seen on ultrasound last time.   Prentice Docker, MD 05/15/2017 12:06 PM

## 2017-05-16 NOTE — Telephone Encounter (Signed)
IUD check was 8/15, see note.

## 2017-06-26 ENCOUNTER — Ambulatory Visit (INDEPENDENT_AMBULATORY_CARE_PROVIDER_SITE_OTHER): Payer: 59 | Admitting: Obstetrics and Gynecology

## 2017-06-26 ENCOUNTER — Ambulatory Visit (INDEPENDENT_AMBULATORY_CARE_PROVIDER_SITE_OTHER): Payer: 59

## 2017-06-26 VITALS — BP 124/74 | Wt 156.0 lb

## 2017-06-26 DIAGNOSIS — N83209 Unspecified ovarian cyst, unspecified side: Secondary | ICD-10-CM | POA: Diagnosis not present

## 2017-06-26 DIAGNOSIS — N83202 Unspecified ovarian cyst, left side: Secondary | ICD-10-CM

## 2017-06-26 NOTE — Progress Notes (Signed)
Gynecology Ultrasound Follow Up   Chief Complaint  Patient presents with  . Follow-up  Left ovarian cyst   History of Present Illness: Patient is a 41 y.o. female who presents today for ultrasound evaluation of left ovarian cyst .  Ultrasound demonstrates the following findings Adnexa: left ovary with small follicle Uterus: anteverted with endometrial stripe  Could not measure due to IUD  Additional: no other abnormal findings.   Past Medical History:  Diagnosis Date  . Allergy   . GERD (gastroesophageal reflux disease)   . Migraine   . Migraines     Past Surgical History:  Procedure Laterality Date  . CHOLECYSTECTOMY N/A 02/25/2015   Procedure: LAPAROSCOPIC CHOLECYSTECTOMY WITH INTRAOPERATIVE CHOLANGIOGRAM;  Surgeon: Christene Lye, MD;  Location: [facility] ORS;  Service: General;  Laterality: N/A;  . SHOULDER ARTHROSCOPY Left 11/17/2015   Procedure: ARTHROSCOPY SHOULDER acrominal repair, decompression left ;  Surgeon: Corky Mull, MD;  Location: [facility] ORS;  Service: Orthopedics;  Laterality: Left;    Family History  Problem Relation Age of Onset  . Cancer Mother 35       colon  . Diabetes Father     Social History   Social History  . Marital status: Married    Spouse name: N/A  . Number of children: N/A  . Years of education: N/A   Occupational History  . Not on file.   Social History Main Topics  . Smoking status: Never Smoker  . Smokeless tobacco: Never Used  . Alcohol use 0.0 oz/week     Comment: 1/day  . Drug use: No  . Sexual activity: Yes    Birth control/ protection: IUD   Other Topics Concern  . Not on file   Social History Narrative  . No narrative on file    Allergies  Allergen Reactions  . Mupirocin Calcium   . Bacitracin Rash    Prior to Admission medications   Medication Sig Start Date End Date Taking? Authorizing Provider  albuterol (PROVENTIL HFA;VENTOLIN HFA) 108 (90 Base) MCG/ACT inhaler Inhale 2 puffs into the lungs  every 6 (six) hours as needed for wheezing or shortness of breath. 01/26/16   Roselee Nova, MD  Cetirizine HCl (ZYRTEC ALLERGY PO) Take 1 tablet by mouth daily as needed.    [provider]  Cholecalciferol (VITAMIN D PO) Take 4,000 mg by mouth daily.    [provider]  HYDROcodone-acetaminophen (NORCO) 5-325 MG tablet Take 1 tablet by mouth every 6 (six) hours as needed for severe pain. 04/11/17   Darel Hong, MD  ibuprofen (ADVIL,MOTRIN) 600 MG tablet Take 1 tablet (600 mg total) by mouth every 8 (eight) hours as needed. 04/11/17   Darel Hong, MD  Lactobacillus (ACIDOPHILUS PO) Take by mouth.    [provider]  levonorgestrel (MIRENA) 20 MCG/24HR IUD 1 Intra Uterine Device (1 each total) by Intrauterine route once. 04/16/17 04/16/17  Will Bonnet, MD  meclizine (ANTIVERT) 25 MG tablet Take 25 mg by mouth 3 (three) times daily as needed for dizziness.    [provider]  Melatonin 5 MG TABS Take 5 mg by mouth daily.    [provider]  Olopatadine HCl 0.2 % SOLN  04/10/17   [provider]  topiramate (TOPAMAX) 100 MG tablet Take 100 mg by mouth 2 (two) times daily.     [provider]  vitamin B-12 (CYANOCOBALAMIN) 1000 MCG tablet Take 1,000 mcg by mouth daily.    [provider]    Physical Exam BP 124/74   Wt 156 lb (70.8 kg)   BMI 26.78 kg/m    General: NAD HEENT: normocephalic, anicteric Pulmonary: No increased work of breathing Extremities: no edema, erythema, or tenderness Neurologic: Grossly intact, normal gait Psychiatric: mood appropriate, affect full   Assessment: 41 y.o. G3P3 with history of left ovarian cyst on last ultrasound.  Resolved today. Small follicle (2 cm) noted.   Plan: Problem List Items Addressed This Visit    None    Visit Diagnoses    Left ovarian cyst    -  Primary   resolved     Patient reassured regarding finding.  Routine follow up  Prentice Docker,  MD 06/26/2017 2:12 PM

## 2017-07-01 HISTORY — PX: ROTATOR CUFF REPAIR: SHX139

## 2017-08-15 ENCOUNTER — Ambulatory Visit: Payer: 59 | Admitting: Family Medicine

## 2018-04-22 ENCOUNTER — Encounter (INDEPENDENT_AMBULATORY_CARE_PROVIDER_SITE_OTHER): Payer: Self-pay | Admitting: Physician Assistant

## 2018-05-13 ENCOUNTER — Ambulatory Visit (INDEPENDENT_AMBULATORY_CARE_PROVIDER_SITE_OTHER): Payer: 59 | Admitting: Obstetrics and Gynecology

## 2018-05-13 ENCOUNTER — Encounter: Payer: Self-pay | Admitting: Obstetrics and Gynecology

## 2018-05-13 VITALS — BP 110/64 | Ht 64.0 in | Wt 162.0 lb

## 2018-05-13 DIAGNOSIS — Z1331 Encounter for screening for depression: Secondary | ICD-10-CM | POA: Diagnosis not present

## 2018-05-13 DIAGNOSIS — Z01419 Encounter for gynecological examination (general) (routine) without abnormal findings: Secondary | ICD-10-CM

## 2018-05-13 DIAGNOSIS — Z1339 Encounter for screening examination for other mental health and behavioral disorders: Secondary | ICD-10-CM

## 2018-05-13 NOTE — Progress Notes (Signed)
Gynecology Annual Exam  PCP: Roselee Nova, MD  Chief Complaint  Patient presents with  . Gynecologic Exam   History of Present Illness:  [name] is a 42 y.o. [phone] who LMP was No LMP recorded (lmp unknown). (Menstrual status: IUD)., presents today for her annual examination.  Her menses are absent with her IUD.   She is single partner, contraception - IUD. She does not have vaginal dryness.  Last Pap: 02/14/2016  Results were: no abnormalities /neg HPV DNA.  Hx of STDs: HPV - distant  Last mammogram: 1 year  Results were: normal--routine follow-up in 12 months There is no FH of breast cancer. There is no FH of ovarian cancer. The patient does not do self-breast exams.  Tobacco use: The patient denies current or previous tobacco use. Alcohol use: social drinker Exercise: not active  The patient wears seatbelts: yes.   The patient states that she feels safe at home.  Past Medical History:  Diagnosis Date  . Allergy   . GERD (gastroesophageal reflux disease)   . Migraine   . Migraines     Past Surgical History:  Procedure Laterality Date  . CHOLECYSTECTOMY N/A 02/25/2015   Procedure: LAPAROSCOPIC CHOLECYSTECTOMY WITH INTRAOPERATIVE CHOLANGIOGRAM;  Surgeon: Christene Lye, MD;  Location: [facility] ORS;  Service: General;  Laterality: N/A;  . ROTATOR CUFF REPAIR Left 07/2017  . SHOULDER ARTHROSCOPY Left 11/17/2015   Procedure: ARTHROSCOPY SHOULDER acrominal repair, decompression left ;  Surgeon: Corky Mull, MD;  Location: [facility] ORS;  Service: Orthopedics;  Laterality: Left;    Prior to Admission medications   Medication Sig Start Date End Date Taking? Authorizing Provider  albuterol (PROVENTIL HFA;VENTOLIN HFA) 108 (90 Base) MCG/ACT inhaler Inhale 2 puffs into the lungs every 6 (six) hours as needed for wheezing or shortness of breath. 01/26/16  Yes Roselee Nova, MD  Cetirizine HCl (ZYRTEC ALLERGY PO) Take 1 tablet by mouth daily as needed.   Yes  [provider]  Cholecalciferol (VITAMIN D PO) Take 4,000 mg by mouth daily.   Yes [provider]  ibuprofen (ADVIL,MOTRIN) 600 MG tablet Take 1 tablet (600 mg total) by mouth every 8 (eight) hours as needed. 04/11/17  Yes Darel Hong, MD  Lactobacillus (ACIDOPHILUS PO) Take by mouth.   Yes [provider]  meclizine (ANTIVERT) 25 MG tablet Take 25 mg by mouth 3 (three) times daily as needed for dizziness.   Yes [provider]  topiramate (TOPAMAX) 100 MG tablet Take 100 mg by mouth 2 (two) times daily.    Yes [provider]  levonorgestrel (MIRENA) 20 MCG/24HR IUD 1 Intra Uterine Device (1 each total) by Intrauterine route once. 04/16/17 04/16/17  Will Bonnet, MD  Melatonin 5 MG TABS Take 5 mg by mouth daily.    [provider]    Allergies  Allergen Reactions  . Mupirocin Calcium   . Bacitracin Rash   Obstetric History: G3P3003, s/p SVD x 3.   Family History  Problem Relation Age of Onset  . Cancer Mother 76       colon  . Diabetes Father   . Stroke Father     Social History   Socioeconomic History  . Marital status: Married    Spouse name: Not on file  . Number of children: Not on file  . Years of education: Not on file  . Highest education level: Not on file  Occupational History  . Not on file  Social Needs  . Financial resource strain: Not on file  . Food insecurity:    Worry: Not on file    Inability: Not on file  . Transportation needs:    Medical: Not on file    Non-medical: Not on file  Tobacco Use  . Smoking status: Never Smoker  . Smokeless tobacco: Never Used  Substance and Sexual Activity  . Alcohol use: Yes    Alcohol/week: 0.0 standard drinks    Comment: 1/day  . Drug use: No  . Sexual activity: Yes    Birth control/protection: IUD  Lifestyle  . Physical activity:    Days per week: 2 days    Minutes per session: Not on file  . Stress: Not on file  Relationships  . Social  connections:    Talks on phone: Not on file    Gets together: Not on file    Attends religious service: Not on file    Active member of club or organization: Not on file    Attends meetings of clubs or organizations: Not on file    Relationship status: Not on file  . Intimate partner violence:    Fear of current or ex partner: Not on file    Emotionally abused: Not on file    Physically abused: Not on file    Forced sexual activity: Not on file  Other Topics Concern  . Not on file  Social History Narrative  . Not on file    Review of Systems  Constitutional: Positive for malaise/fatigue. Negative for chills, diaphoresis, fever and weight loss.  HENT: Negative.   Eyes: Negative.   Respiratory: Negative.   Cardiovascular: Negative.   Gastrointestinal: Negative.   Genitourinary: Negative.   Musculoskeletal: Negative.   Skin: Negative.   Neurological: Negative.   Psychiatric/Behavioral: Negative.      Physical Exam BP 110/64 (BP Location: Left Arm, Patient Position: Sitting, Cuff Size: Normal)   Ht 5\' 4"  (1.626 m)   Wt 162 lb (73.5 kg)   LMP  (LMP Unknown)   BMI 27.81 kg/m   Physical Exam  Constitutional: She is oriented to person, place, and time. She appears well-developed and well-nourished. No distress.  Genitourinary: Uterus normal. Pelvic exam was performed with patient supine. There is no rash, tenderness, lesion or injury on the right labia. There is no rash, tenderness, lesion or injury on the left labia. No erythema, tenderness or bleeding in the vagina. No signs of injury around the vagina. No vaginal discharge found. Right adnexum does not display mass, does not display tenderness and does not display fullness. Left adnexum does not display mass, does not display tenderness and does not display fullness. Cervix does not exhibit motion tenderness, lesion, discharge, polyp or visible IUD strings.   Uterus is mobile and anteverted. Uterus is not enlarged, tender or  exhibiting a mass.  HENT:  Head: Normocephalic and atraumatic.  Eyes: EOM are normal. No scleral icterus.  Neck: Normal range of motion. Neck supple. No thyromegaly present.  Cardiovascular: Normal rate and regular rhythm. Exam reveals no gallop and no friction rub.  No murmur heard. Pulmonary/Chest: Effort normal and breath sounds normal. No respiratory distress. She has no wheezes. She has no rales. Right breast exhibits no inverted nipple, no mass, no nipple discharge, no skin change and no tenderness. Left breast exhibits no inverted nipple, no mass, no nipple discharge, no skin change and no tenderness.  Abdominal: Soft. Bowel sounds are normal. She exhibits no distension and no  mass. There is no tenderness. There is no rebound and no guarding.  Musculoskeletal: Normal range of motion. She exhibits no edema or tenderness.  Lymphadenopathy:    She has no cervical adenopathy.       Right: No inguinal adenopathy present.       Left: No inguinal adenopathy present.  Neurological: She is alert and oriented to person, place, and time. No cranial nerve deficit.  Skin: Skin is warm and dry. No rash noted. No erythema.  Psychiatric: She has a normal mood and affect. Her behavior is normal. Judgment normal.   Female chaperone present for pelvic and breast  portions of the physical exam  Results: AUDIT Questionnaire (screen for alcoholism): 3 PHQ-9: 3  Assessment: 42 y.o. G55P3003 female here for routine gynecologic examination.  Plan: Problem List Items Addressed This Visit    None    Visit Diagnoses    Women's annual routine gynecological examination    -  Primary   Screening for depression       Screening for alcoholism          Screening: -- Blood pressure screen normal -- Colonoscopy - not due -- Mammogram - not due -- Weight screening: overweight: continue to monitor -- Depression screening negative (PHQ-9) -- Nutrition: normal -- cholesterol screening: per PCP --  osteoporosis screening: not due -- tobacco screening: not using -- alcohol screening: AUDIT questionnaire indicates low-risk usage. -- family history of breast cancer screening: done. not at high risk. -- no evidence of domestic violence or intimate partner violence. -- STD screening: gonorrhea/chlamydia NAAT not collected per patient request. -- pap smear not collected per ASCCP guidelines  Fatigue: she is being worked up for fatigue of recent onset by her PCP.   Prentice Docker, MD 05/13/2018 5:10 PM

## 2018-06-30 ENCOUNTER — Encounter: Payer: Self-pay | Admitting: Obstetrics and Gynecology

## 2018-06-30 LAB — HM MAMMOGRAPHY

## 2018-07-19 IMAGING — CT CT ABD-PELV W/ CM
2 of 5 series · 16 of 46 positions shown, 18 images · IV contrast (APPLIED)
Comparison: None.

CLINICAL DATA: Severe lower abdominal pain for 1 hour, nausea and
urinary urgency.

EXAM:
CT ABDOMEN AND PELVIS WITH CONTRAST
TECHNIQUE: Multidetector CT imaging of the abdomen and pelvis was performed
using the standard protocol following bolus administration of
intravenous contrast.
CONTRAST:  100mL GG5BCU-VLL IOPAMIDOL (GG5BCU-VLL) INJECTION 61%

[Series 2: routine abd/pel with · axial · 0.77mm/px · z∈[-306,+74]mm · 13 of 86 slices shown, 15 images]
[im 5/86  soft-tissue]
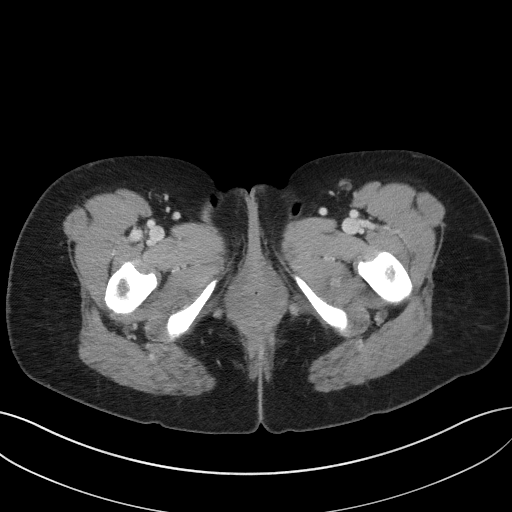
[im 5/86  bone]
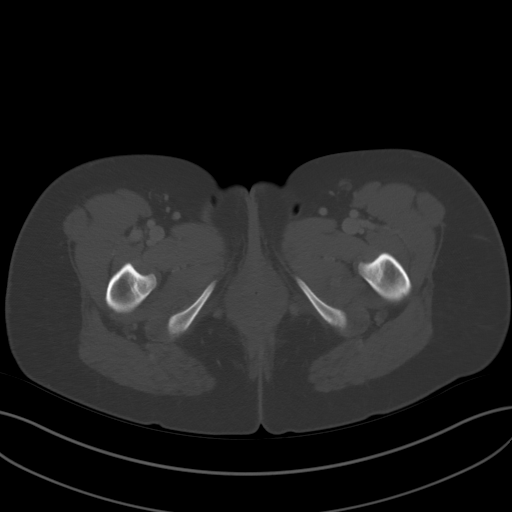
[im 13/86  soft-tissue]
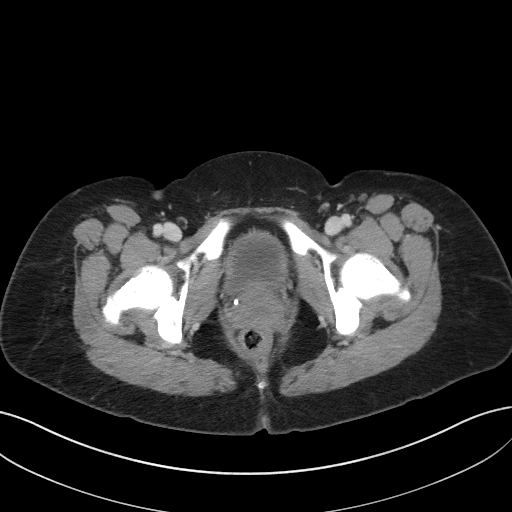
[im 18/86  soft-tissue]
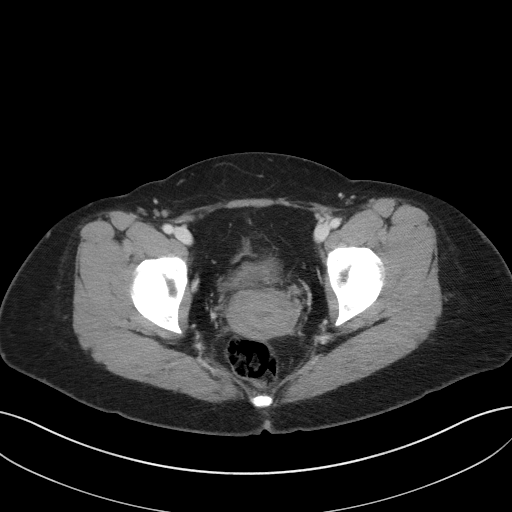
[im 26/86  soft-tissue]
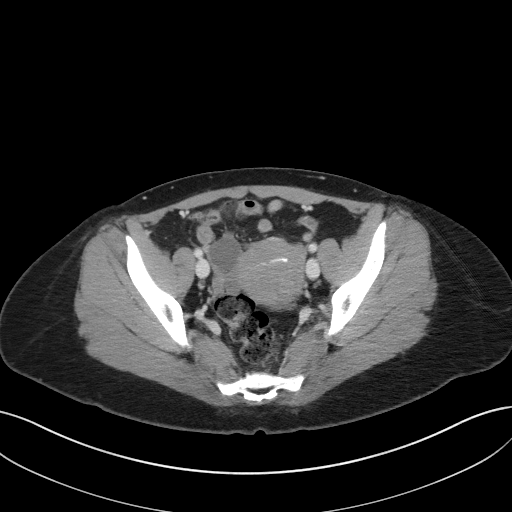
[im 30/86  soft-tissue]
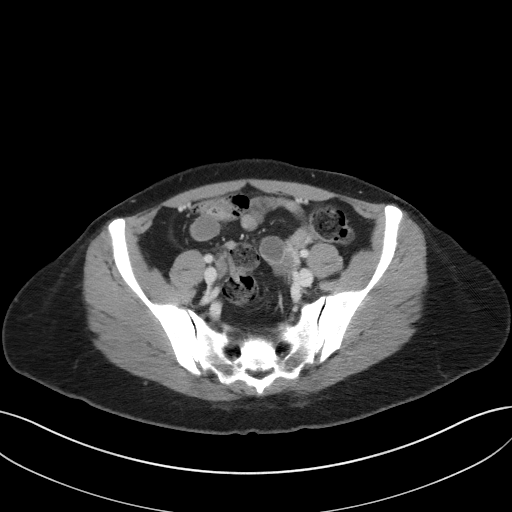
[im 39/86  soft-tissue]
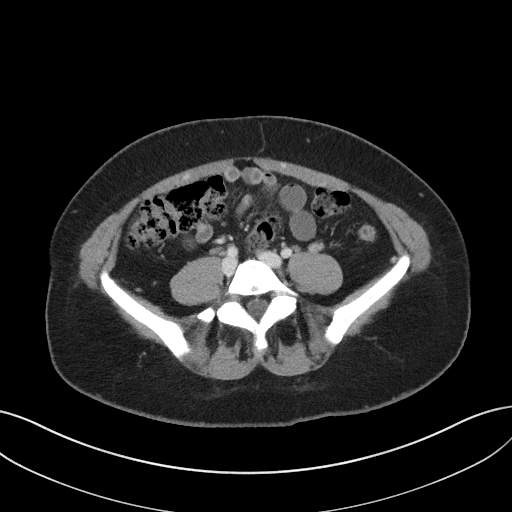
[im 43/86  soft-tissue]
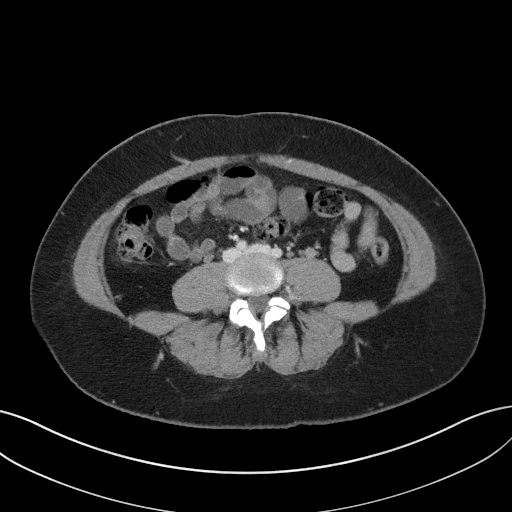
[im 47/86  soft-tissue]
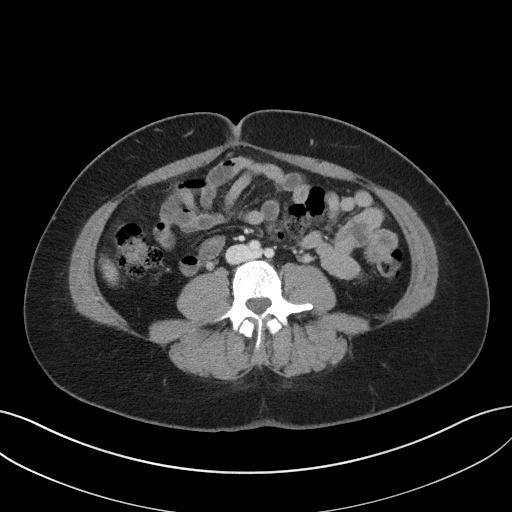
[im 56/86  soft-tissue]
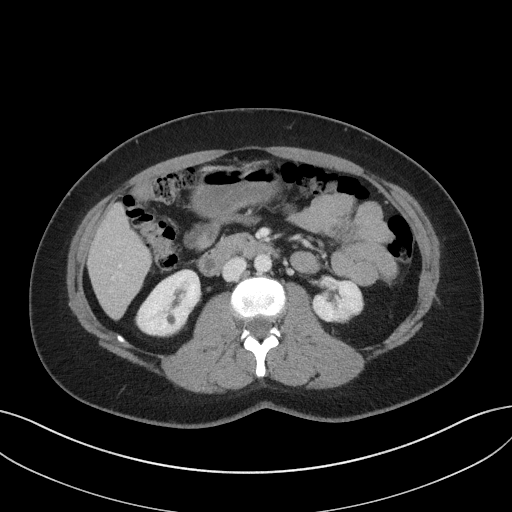
[im 56/86  bone]
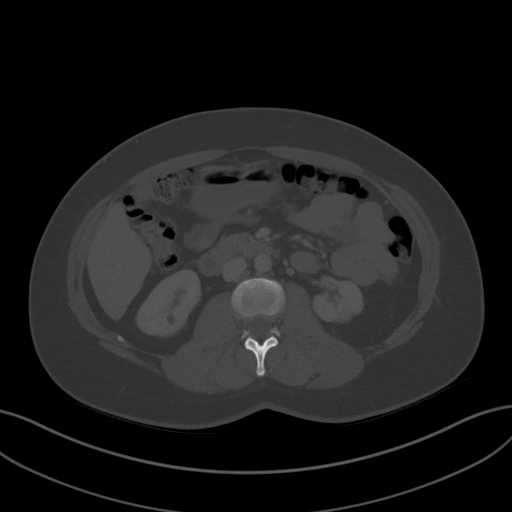
[im 60/86  soft-tissue]
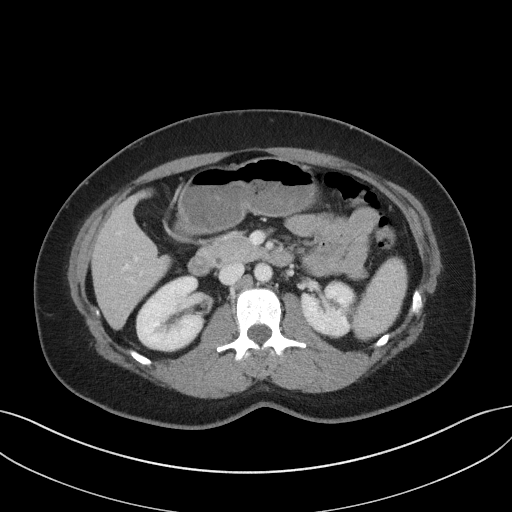
[im 69/86  soft-tissue]
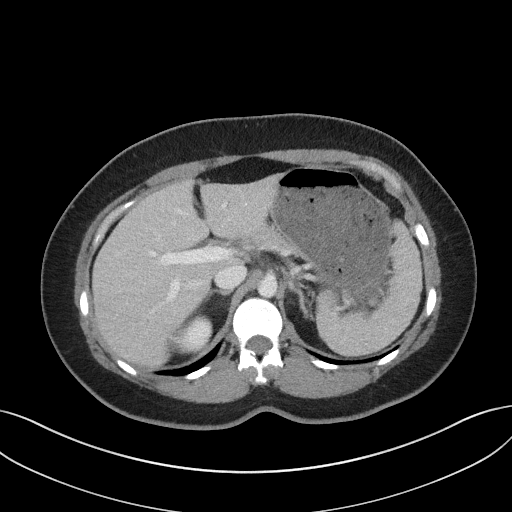
[im 73/86  soft-tissue]
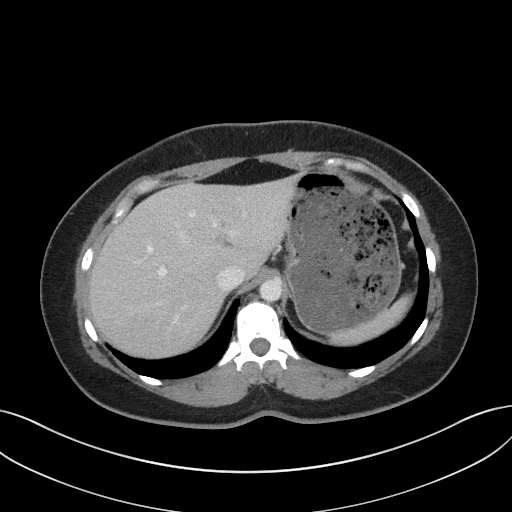
[im 81/86  soft-tissue]
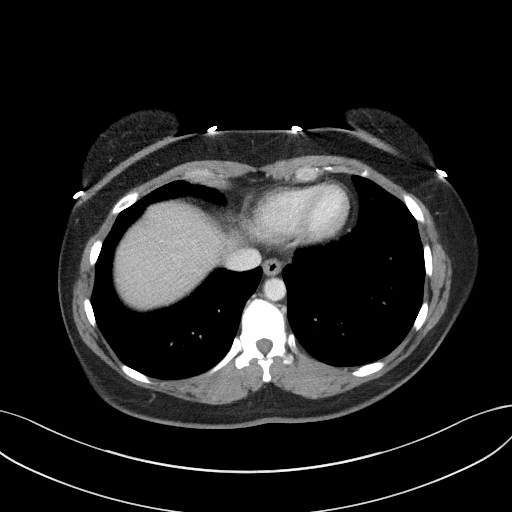

[Series 4: coronal st · coronal · 0.68mm/px · 3 of 81 slices shown]
[im 27/81  soft-tissue]
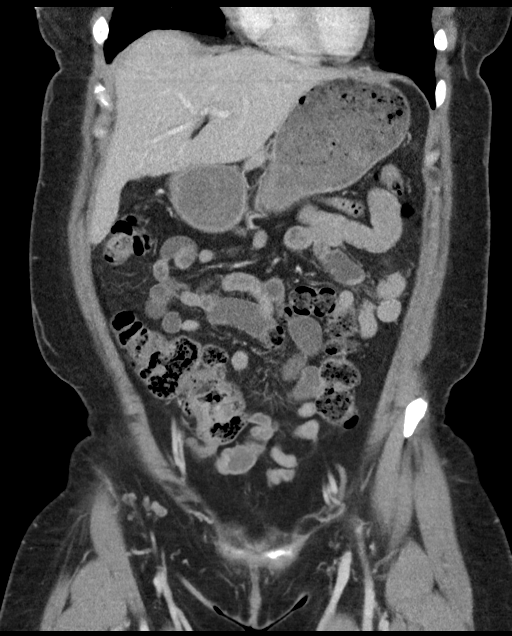
[im 36/81  soft-tissue]
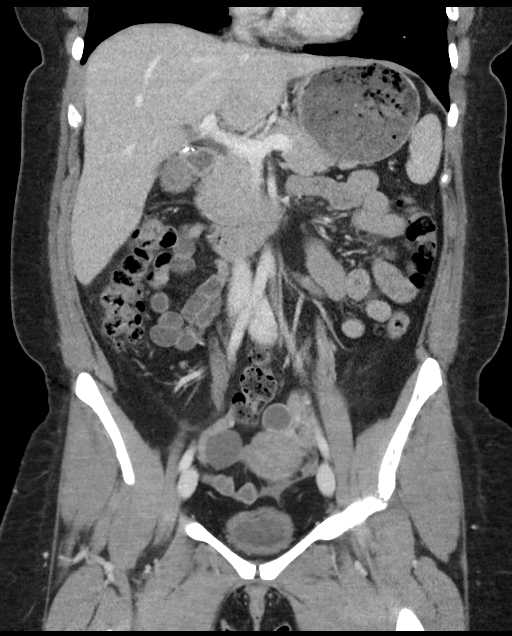
[im 45/81  soft-tissue]
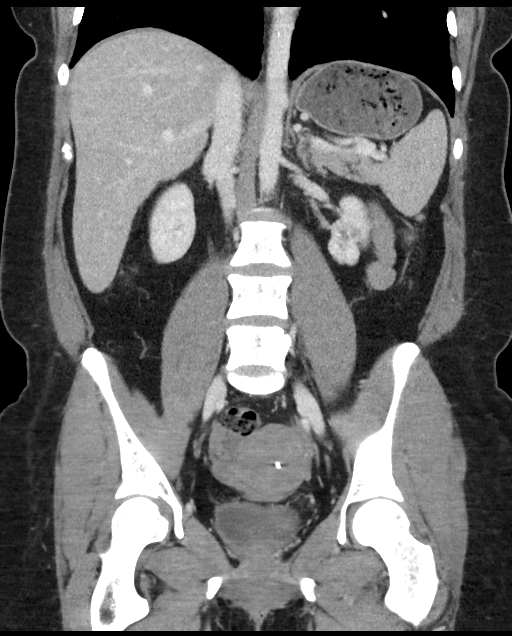

[16 of 46 positions shown; findings below may reference images not displayed]

FINDINGS: LOWER CHEST: Lung bases are clear. Included heart size is normal. No
pericardial effusion.

HEPATOBILIARY: Status post cholecystectomy.  Normal liver.

PANCREAS: Normal.

SPLEEN: Normal.

ADRENALS/URINARY TRACT: Kidneys are orthotopic, demonstrating
symmetric enhancement. Multifocal LEFT cortical scarring with mildly
atrophic LEFT kidney. Punctate LEFT upper pole nephrolithiasis.
Multiple small RIGHT renal cysts measuring to 11 mm. No
hydronephrosis.

STOMACH/BOWEL: The stomach, small and large bowel are normal in
course and caliber without inflammatory changes. Normal appendix.

VASCULAR/LYMPHATIC: Aortoiliac vessels are normal in course and
caliber. No lymphadenopathy by CT size criteria.

REPRODUCTIVE: Thickened appearing cervix. IUD in central uterus.
cm dominant RIGHT adnexal follicle.

OTHER: No intraperitoneal free fluid or free air.

MUSCULOSKELETAL: Nonacute. Sacroiliac sclerosis and mild articular
irregularity most compatible with sacroiliitis. Small L5-S1
broad-based disc osteophyte complex.
IMPRESSION: Thickened cervix which could reflect redundant tissue or cervicitis.

Atrophic, scarred LEFT kidney. Punctate nonobstructing LEFT upper
pole nephrolithiasis.

## 2018-10-26 ENCOUNTER — Other Ambulatory Visit: Payer: Self-pay | Admitting: Obstetrics and Gynecology

## 2018-10-26 DIAGNOSIS — N3000 Acute cystitis without hematuria: Secondary | ICD-10-CM

## 2018-10-26 MED ORDER — NITROFURANTOIN MONOHYD MACRO 100 MG PO CAPS: 100.0000 mg | ORAL_CAPSULE | Freq: Two times a day (BID) | ORAL | 0 refills | Status: AC

## 2018-10-26 NOTE — Progress Notes (Signed)
Patient called nurse line on the weekend requesting rx for UTI.  Prescription sent to pharmacy  [place] 10/26/2018 1:03 PM

## 2019-01-07 ENCOUNTER — Emergency Department
Admission: EM | Admit: 2019-01-07 | Discharge: 2019-01-07 | Disposition: A | Discharge status: Home / Self Care | Payer: 59 | Attending: Emergency Medicine | Admitting: Emergency Medicine

## 2019-01-07 ENCOUNTER — Other Ambulatory Visit: Payer: Self-pay

## 2019-01-07 DIAGNOSIS — Z79899 Other long term (current) drug therapy: Secondary | ICD-10-CM | POA: Diagnosis not present

## 2019-01-07 DIAGNOSIS — S61217A Laceration without foreign body of left little finger without damage to nail, initial encounter: Secondary | ICD-10-CM | POA: Insufficient documentation

## 2019-01-07 DIAGNOSIS — W268XXA Contact with other sharp object(s), not elsewhere classified, initial encounter: Secondary | ICD-10-CM | POA: Diagnosis not present

## 2019-01-07 DIAGNOSIS — Y999 Unspecified external cause status: Secondary | ICD-10-CM | POA: Diagnosis not present

## 2019-01-07 DIAGNOSIS — Y929 Unspecified place or not applicable: Secondary | ICD-10-CM | POA: Diagnosis not present

## 2019-01-07 DIAGNOSIS — Y9389 Activity, other specified: Secondary | ICD-10-CM | POA: Insufficient documentation

## 2019-01-07 DIAGNOSIS — Z23 Encounter for immunization: Secondary | ICD-10-CM | POA: Diagnosis not present

## 2019-01-07 DIAGNOSIS — S61219A Laceration without foreign body of unspecified finger without damage to nail, initial encounter: Secondary | ICD-10-CM

## 2019-01-07 MED ORDER — LIDOCAINE HCL (PF) 1 % IJ SOLN
5.0000 mL | Freq: Once | INTRAMUSCULAR | Status: AC
  Administered 2019-01-07: 5 mL via INTRADERMAL
  Filled 2019-01-07: qty 5

## 2019-01-07 MED ORDER — TETANUS-DIPHTH-ACELL PERTUSSIS 5-2.5-18.5 LF-MCG/0.5 IM SUSP
0.5000 mL | Freq: Once | INTRAMUSCULAR | Status: AC
  Administered 2019-01-07: 0.5 mL via INTRAMUSCULAR
  Filled 2019-01-07: qty 0.5
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
## 2019-01-07 NOTE — Discharge Instructions (Signed)
Do not get the sutured area wet for 24 hours. After 24 hours, shower/bathe as usual and pat the area dry. °Change the bandage 2 times per day and apply antibiotic ointment. °Leave open to air when at no risk of getting the area dirty, but cover at night before bed. °See your PCP or go to Urgent Care in 10 days for suture removal or sooner for signs or concern of infection. ° °
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
## 2019-05-29 ENCOUNTER — Ambulatory Visit (INDEPENDENT_AMBULATORY_CARE_PROVIDER_SITE_OTHER): Payer: 59 | Admitting: Obstetrics and Gynecology

## 2019-05-29 ENCOUNTER — Encounter: Payer: Self-pay | Admitting: Obstetrics and Gynecology

## 2019-05-29 ENCOUNTER — Other Ambulatory Visit: Payer: Self-pay

## 2019-05-29 VITALS — BP 128/84 | Ht 64.0 in | Wt 174.0 lb

## 2019-05-29 DIAGNOSIS — Z1331 Encounter for screening for depression: Secondary | ICD-10-CM

## 2019-05-29 DIAGNOSIS — Z124 Encounter for screening for malignant neoplasm of cervix: Secondary | ICD-10-CM

## 2019-05-29 DIAGNOSIS — Z01419 Encounter for gynecological examination (general) (routine) without abnormal findings: Secondary | ICD-10-CM

## 2019-05-29 DIAGNOSIS — Z1339 Encounter for screening examination for other mental health and behavioral disorders: Secondary | ICD-10-CM

## 2019-05-29 NOTE — Progress Notes (Signed)
Gynecology Annual Exam  PCP: Roselee Nova, MD   Chief Complaint  Patient presents with  . Gynecologic Exam    Mammogram order   History of Present Illness:  Ms. MARYBETH ZUMBRUNNEN is a 43 y.o. G3P3003 who LMP was No LMP recorded. (Menstrual status: IUD)., presents today for her annual examination.  Her menses are absent due to the IUD. The IUD was placed two years ago.  She is sexually active. No issues with intercourse.  Last Pap: a few years ago.  Results were: no abnormalities /neg HPV DNA negative (distant history of HPV) Hx of STDs: HPV  Last mammogram: 1 year  Results were: normal--routine follow-up in 12 months There is no FH of breast cancer. There is no FH of ovarian cancer. The patient does not do self-breast exams.  Tobacco use: The patient denies current or previous tobacco use. Alcohol use: social drinker Exercise: not active  The patient wears seatbelts: yes.      Past Medical History:  Diagnosis Date  . Allergy   . GERD (gastroesophageal reflux disease)   . Migraine   . Migraines     Past Surgical History:  Procedure Laterality Date  . CHOLECYSTECTOMY N/A 02/25/2015   Procedure: LAPAROSCOPIC CHOLECYSTECTOMY WITH INTRAOPERATIVE CHOLANGIOGRAM;  Surgeon: Christene Lye, MD;  Location: [facility] ORS;  Service: General;  Laterality: N/A;  . ROTATOR CUFF REPAIR Left 07/2017  . SHOULDER ARTHROSCOPY Left 11/17/2015   Procedure: ARTHROSCOPY SHOULDER acrominal repair, decompression left ;  Surgeon: Corky Mull, MD;  Location: [facility] ORS;  Service: Orthopedics;  Laterality: Left;    Prior to Admission medications   Medication Sig Start Date End Date Taking? Authorizing Provider  albuterol (PROVENTIL HFA;VENTOLIN HFA) 108 (90 Base) MCG/ACT inhaler Inhale 2 puffs into the lungs every 6 (six) hours as needed for wheezing or shortness of breath. 01/26/16  Yes Roselee Nova, MD  Cetirizine HCl (ZYRTEC ALLERGY PO) Take 1 tablet by mouth daily as needed.   Yes  [provider]  Erenumab-aooe (AIMOVIG) 140 MG/ML SOAJ Inject into the skin. 03/03/19  Yes [provider]  ibuprofen (ADVIL,MOTRIN) 600 MG tablet Take 1 tablet (600 mg total) by mouth every 8 (eight) hours as needed. 04/11/17  Yes Darel Hong, MD  Lactobacillus (ACIDOPHILUS PO) Take by mouth.   Yes [provider]  meclizine (ANTIVERT) 25 MG tablet Take 25 mg by mouth 3 (three) times daily as needed for dizziness.   Yes [provider]  Cholecalciferol (VITAMIN D PO) Take 4,000 mg by mouth daily.    [provider]  levonorgestrel (MIRENA) 20 MCG/24HR IUD 1 Intra Uterine Device (1 each total) by Intrauterine route once. 04/16/17 04/16/17  Will Bonnet, MD  Melatonin 5 MG TABS Take 5 mg by mouth daily.    [provider]  topiramate (TOPAMAX) 100 MG tablet Take 100 mg by mouth 2 (two) times daily.     [provider]    Allergies  Allergen Reactions  . Mupirocin Calcium   . Bacitracin Rash    Gynecologic History: No LMP recorded. (Menstrual status: IUD).  Obstetric History: [id number]  Social History   Socioeconomic History  . Marital status: Married    Spouse name: Not on file  . Number of children: Not on file  . Years of education: Not on file  . Highest education level: Not on file  Occupational History  . Not on file  Social Needs  . Financial resource strain: Not  on file  . Food insecurity    Worry: Not on file    Inability: Not on file  . Transportation needs    Medical: Not on file    Non-medical: Not on file  Tobacco Use  . Smoking status: Never Smoker  . Smokeless tobacco: Never Used  Substance and Sexual Activity  . Alcohol use: Yes    Alcohol/week: 0.0 standard drinks    Comment: 1/day  . Drug use: No  . Sexual activity: Yes    Birth control/protection: I.U.D.  Lifestyle  . Physical activity    Days per week: 2 days    Minutes per session: Not on file  . Stress: Not on file   Relationships  . Social Herbalist on phone: Not on file    Gets together: Not on file    Attends religious service: Not on file    Active member of club or organization: Not on file    Attends meetings of clubs or organizations: Not on file    Relationship status: Not on file  . Intimate partner violence    Fear of current or ex partner: Not on file    Emotionally abused: Not on file    Physically abused: Not on file    Forced sexual activity: Not on file  Other Topics Concern  . Not on file  Social History Narrative  . Not on file    Family History  Problem Relation Age of Onset  . Cancer Mother 88       colon  . Diabetes Father   . Stroke Father     Review of Systems  Constitutional: Negative.   HENT: Negative.   Eyes: Negative.   Respiratory: Negative.   Cardiovascular: Negative.   Gastrointestinal: Negative.   Genitourinary: Negative.   Musculoskeletal: Negative.   Skin: Negative.   Neurological: Negative.   Psychiatric/Behavioral: Negative.      Physical Exam BP 128/84   Ht 5\' 4"  (1.626 m)   Wt 174 lb (78.9 kg)   BMI 29.87 kg/m    Physical Exam Constitutional:      General: She is not in acute distress.    Appearance: Normal appearance. She is well-developed.  Genitourinary:     Pelvic exam was performed with patient in the lithotomy position.     Vulva, urethra, bladder and uterus normal.     No inguinal adenopathy present in the right or left side.    No signs of injury in the vagina.     No vaginal discharge, erythema, tenderness or bleeding.     No cervical motion tenderness, discharge, lesion or polyp.     Uterus is mobile.     Uterus is not enlarged or tender.     No uterine mass detected.    Uterus is anteverted.     No right or left adnexal mass present.     Right adnexa not tender or full.     Left adnexa not tender or full.     Genitourinary Comments: IUD Strings not visualized (same as last visit)  HENT:     Head:  Normocephalic and atraumatic.  Eyes:     General: No scleral icterus.    Conjunctiva/sclera: Conjunctivae normal.  Neck:     Musculoskeletal: Normal range of motion and neck supple.     Thyroid: No thyromegaly.  Cardiovascular:     Rate and Rhythm: Normal rate and regular rhythm.     Heart sounds: No  murmur. No friction rub. No gallop.   Pulmonary:     Effort: Pulmonary effort is normal. No respiratory distress.     Breath sounds: Normal breath sounds. No wheezing or rales.  Chest:     Breasts:        Right: No inverted nipple, mass, nipple discharge, skin change or tenderness.        Left: No inverted nipple, mass, nipple discharge, skin change or tenderness.  Abdominal:     General: Bowel sounds are normal. There is no distension.     Palpations: Abdomen is soft. There is no mass.     Tenderness: There is no abdominal tenderness. There is no guarding or rebound.  Musculoskeletal: Normal range of motion.        General: No swelling or tenderness.  Lymphadenopathy:     Cervical: No cervical adenopathy.     Lower Body: No right inguinal adenopathy. No left inguinal adenopathy.  Neurological:     General: No focal deficit present.     Mental Status: She is alert and oriented to person, place, and time.     Cranial Nerves: No cranial nerve deficit.  Skin:    General: Skin is warm and dry.     Findings: No erythema or rash.  Psychiatric:        Mood and Affect: Mood normal.        Behavior: Behavior normal.        Judgment: Judgment normal.     Female chaperone present for pelvic and breast  portions of the physical exam  Results: AUDIT Questionnaire (screen for alcoholism): 4 PHQ-9: 2   Assessment: 43 y.o. G6P3003 female here for routine annual gynecologic examination.  Plan: Problem List Items Addressed This Visit    None    Visit Diagnoses    Women's annual routine gynecological examination    -  Primary   Relevant Orders   IGP, Aptima HPV, rfx 16/18,45    Screening for depression       Screening for alcoholism       Pap smear for cervical cancer screening       Relevant Orders   IGP, Aptima HPV, rfx 16/18,45      Screening: -- Blood pressure screen normal -- Colonoscopy - not due -- Mammogram - due. Patient to call [facility] to arrange. She understands that it is her responsibility to arrange this. -- Weight screening: overweight: continue to monitor -- Depression screening negative (PHQ-9) -- Nutrition: normal -- cholesterol screening: not due for screening -- osteoporosis screening: not due -- tobacco screening: not using -- alcohol screening: AUDIT questionnaire indicates low-risk usage. -- family history of breast cancer screening: done. not at high risk. -- no evidence of domestic violence or intimate partner violence. -- STD screening: gonorrhea/chlamydia NAAT not collected per patient request. -- pap smear collected per ASCCP guidelines  Prentice Docker, MD 05/29/2019 1:13 PM

## 2019-06-02 ENCOUNTER — Other Ambulatory Visit: Payer: Self-pay | Admitting: Obstetrics and Gynecology

## 2019-06-02 DIAGNOSIS — Z1231 Encounter for screening mammogram for malignant neoplasm of breast: Secondary | ICD-10-CM

## 2019-06-03 LAB — IGP, APTIMA HPV, RFX 16/18,45: HPV Aptima: NEGATIVE

## 2019-06-15 ENCOUNTER — Telehealth: Payer: Self-pay

## 2019-06-15 NOTE — Telephone Encounter (Signed)
Pt calling triage stating she is having symptoms of a UTI but is on vacation and would like to have something called in for her while she is away. Please advise. Pt was last seen on 05/29/19.

## 2019-06-16 ENCOUNTER — Other Ambulatory Visit: Payer: Self-pay | Admitting: Obstetrics and Gynecology

## 2019-06-16 DIAGNOSIS — N3000 Acute cystitis without hematuria: Secondary | ICD-10-CM

## 2019-06-16 MED ORDER — NITROFURANTOIN MONOHYD MACRO 100 MG PO CAPS: 100.0000 mg | ORAL_CAPSULE | Freq: Two times a day (BID) | ORAL | 1 refills | Status: DC

## 2019-06-16 NOTE — Telephone Encounter (Signed)
Sent!

## 2019-06-16 NOTE — Telephone Encounter (Signed)
Pt called back waiting for response

## 2019-06-16 NOTE — Telephone Encounter (Signed)
Tried to call pt. Please let me know when she calls back or get this info from her so I can let [facility] send in RX. Thank you

## 2019-06-16 NOTE — Telephone Encounter (Signed)
Patient called after hours on call service reporting urinary frequency, burning and pain. Has had UTI before. This time for 3 days. No fever, Had some flank pain but that has resolved. Urine is dark yellow. No blood in urine. After hours nurse advised patient to Drink extra fluids (8-10 glasses q day), cranberry juice recommended, warm saline sitz bath for pain and to be seen by PCP within 24 hours.

## 2019-06-16 NOTE — Telephone Encounter (Signed)
[facility] [place]. Phone Number [phone].

## 2019-06-16 NOTE — Telephone Encounter (Signed)
Can you do this please

## 2019-07-03 ENCOUNTER — Ambulatory Visit
Admission: RE | Admit: 2019-07-03 | Discharge: 2019-07-03 | Disposition: A | Discharge status: Home / Self Care | Payer: 59 | Source: Ambulatory Visit | Attending: Obstetrics and Gynecology | Admitting: Obstetrics and Gynecology

## 2019-07-03 DIAGNOSIS — Z1231 Encounter for screening mammogram for malignant neoplasm of breast: Secondary | ICD-10-CM | POA: Diagnosis not present

## 2019-07-06 ENCOUNTER — Other Ambulatory Visit: Payer: Self-pay | Admitting: Obstetrics and Gynecology

## 2019-07-06 DIAGNOSIS — R928 Other abnormal and inconclusive findings on diagnostic imaging of breast: Secondary | ICD-10-CM

## 2019-07-06 DIAGNOSIS — N6489 Other specified disorders of breast: Secondary | ICD-10-CM

## 2019-07-08 ENCOUNTER — Inpatient Hospital Stay
Admission: RE | Admit: 2019-07-08 | Discharge: 2019-07-08 | Disposition: A | Discharge status: Home / Self Care | Payer: Self-pay | Source: Ambulatory Visit | Attending: Obstetrics and Gynecology | Admitting: Obstetrics and Gynecology

## 2019-07-08 ENCOUNTER — Other Ambulatory Visit: Payer: Self-pay | Admitting: Obstetrics and Gynecology

## 2019-07-08 DIAGNOSIS — Z1231 Encounter for screening mammogram for malignant neoplasm of breast: Secondary | ICD-10-CM

## 2019-07-14 ENCOUNTER — Ambulatory Visit
Admission: RE | Admit: 2019-07-14 | Discharge: 2019-07-14 | Disposition: A | Discharge status: Home / Self Care | Payer: 59 | Source: Ambulatory Visit | Attending: Obstetrics and Gynecology | Admitting: Obstetrics and Gynecology

## 2019-07-14 DIAGNOSIS — R928 Other abnormal and inconclusive findings on diagnostic imaging of breast: Secondary | ICD-10-CM

## 2019-07-14 DIAGNOSIS — N6489 Other specified disorders of breast: Secondary | ICD-10-CM

## 2019-11-29 ENCOUNTER — Ambulatory Visit: Payer: 59 | Attending: Internal Medicine

## 2019-11-29 DIAGNOSIS — Z23 Encounter for immunization: Secondary | ICD-10-CM | POA: Insufficient documentation

## 2019-11-29 NOTE — Progress Notes (Signed)
   Covid-19 Vaccination Clinic  Name:  Jenna White    MRN: [id number] DOB: 01/30/1976  11/29/2019  Jenna White was observed post Covid-19 immunization for 15 minutes without incidence. She was provided with Vaccine Information Sheet and instruction to access the V-Safe system.   Jenna White was instructed to call 911 with any severe reactions post vaccine: Marland Kitchen Difficulty breathing  . Swelling of your face and throat  . A fast heartbeat  . A bad rash all over your body  . Dizziness and weakness    Immunizations Administered    Name Date Dose VIS Date Route   Pfizer COVID-19 Vaccine 11/29/2019  8:42 AM 0.3 mL 09/11/2019 Intramuscular   Manufacturer: [place]   Lot: [id number]   [place]: [id number]

## 2019-12-21 ENCOUNTER — Ambulatory Visit: Payer: 59 | Attending: Internal Medicine

## 2019-12-21 DIAGNOSIS — Z23 Encounter for immunization: Secondary | ICD-10-CM

## 2019-12-21 NOTE — Progress Notes (Signed)
   Covid-19 Vaccination Clinic  Name:  Jenna White    MRN: [id number] DOB: May 12, 1976  12/21/2019  [name] was observed post Covid-19 immunization for 15 minutes without incident. She was provided with Vaccine Information Sheet and instruction to access the V-Safe system.   [name] was instructed to call 911 with any severe reactions post vaccine: Marland Kitchen Difficulty breathing  . Swelling of face and throat  . A fast heartbeat  . A bad rash all over body  . Dizziness and weakness   Immunizations Administered    Name Date Dose VIS Date Route   Pfizer COVID-19 Vaccine 12/21/2019  9:09 AM 0.3 mL 09/11/2019 Intramuscular   Manufacturer: [place]   Lot: [id number]   [place]: [id number]

## 2020-02-11 ENCOUNTER — Other Ambulatory Visit: Payer: Self-pay | Admitting: Physician Assistant

## 2020-02-11 DIAGNOSIS — M533 Sacrococcygeal disorders, not elsewhere classified: Secondary | ICD-10-CM

## 2020-02-15 ENCOUNTER — Ambulatory Visit
Admission: RE | Admit: 2020-02-15 | Discharge: 2020-02-15 | Disposition: A | Discharge status: Home / Self Care | Payer: 59 | Attending: Physician Assistant | Admitting: Physician Assistant

## 2020-02-15 ENCOUNTER — Ambulatory Visit
Admission: RE | Admit: 2020-02-15 | Discharge: 2020-02-15 | Disposition: A | Discharge status: Home / Self Care | Payer: 59 | Source: Ambulatory Visit | Attending: Physician Assistant | Admitting: Physician Assistant

## 2020-02-15 ENCOUNTER — Other Ambulatory Visit: Payer: Self-pay

## 2020-02-15 DIAGNOSIS — M533 Sacrococcygeal disorders, not elsewhere classified: Secondary | ICD-10-CM | POA: Diagnosis present

## 2020-05-04 ENCOUNTER — Telehealth: Payer: 59 | Admitting: Family

## 2020-05-04 DIAGNOSIS — R399 Unspecified symptoms and signs involving the genitourinary system: Secondary | ICD-10-CM | POA: Diagnosis not present

## 2020-05-04 MED ORDER — CEPHALEXIN 500 MG PO CAPS: 500.0000 mg | ORAL_CAPSULE | Freq: Two times a day (BID) | ORAL | 0 refills | Status: DC

## 2020-05-04 NOTE — Progress Notes (Signed)

## 2020-05-04 NOTE — Progress Notes (Signed)
See previous Evisit

## 2020-05-24 ENCOUNTER — Telehealth: Payer: 59 | Admitting: Family

## 2020-05-24 DIAGNOSIS — R399 Unspecified symptoms and signs involving the genitourinary system: Secondary | ICD-10-CM | POA: Diagnosis not present

## 2020-05-24 MED ORDER — FLUCONAZOLE 150 MG PO TABS: 150.0000 mg | ORAL_TABLET | ORAL | 0 refills | Status: DC | PRN

## 2020-05-24 NOTE — Progress Notes (Signed)

## 2020-05-31 ENCOUNTER — Ambulatory Visit: Payer: 59 | Admitting: Obstetrics and Gynecology

## 2020-06-24 ENCOUNTER — Ambulatory Visit: Payer: 59 | Admitting: Obstetrics and Gynecology

## 2020-07-13 ENCOUNTER — Other Ambulatory Visit: Payer: Self-pay

## 2020-07-13 ENCOUNTER — Encounter: Payer: Self-pay | Admitting: Obstetrics and Gynecology

## 2020-07-13 ENCOUNTER — Ambulatory Visit (INDEPENDENT_AMBULATORY_CARE_PROVIDER_SITE_OTHER): Payer: 59 | Admitting: Obstetrics and Gynecology

## 2020-07-13 VITALS — BP 122/80 | Ht 64.0 in | Wt 180.0 lb

## 2020-07-13 DIAGNOSIS — Z1339 Encounter for screening examination for other mental health and behavioral disorders: Secondary | ICD-10-CM | POA: Diagnosis not present

## 2020-07-13 DIAGNOSIS — Z1331 Encounter for screening for depression: Secondary | ICD-10-CM | POA: Diagnosis not present

## 2020-07-13 DIAGNOSIS — Z01419 Encounter for gynecological examination (general) (routine) without abnormal findings: Secondary | ICD-10-CM | POA: Diagnosis not present

## 2020-07-13 NOTE — Progress Notes (Signed)
Gynecology Annual Exam  PCP: Roselee Nova, MD  Chief Complaint  Patient presents with  . Annual Exam   History of Present Illness:  [name] is a 44 y.o. G3P3003 who LMP was No LMP recorded. (Menstrual status: IUD)., presents today for her annual examination.    She does have vasomotor sx. She occasionally has hot flashes, poor sleeping, is easily annoyed, low sex drive.    She is sexually active. She does not have vaginal dryness.  Last Pap: 05/29/2019  Results were: no abnormalities,  HPV DNA negative Hx of STDs: none  Last mammogram: 1 year ago  Results were: normal--routine follow-up in 12 months There is no FH of breast cancer. There is no FH of ovarian cancer. The patient does do self-breast exams.  Colonoscopy: going for consult tomorrow for another follow up. DEXA: has not been screened for osteoporosis  Tobacco use: The patient denies current or previous tobacco use. Alcohol use: social drinker Exercise: not as much as she'd like  The patient wears seatbelts: yes.     Past Medical History:  Diagnosis Date  . Allergy   . Asthma   . GERD (gastroesophageal reflux disease)   . Migraine   . Migraines     Past Surgical History:  Procedure Laterality Date  . CHOLECYSTECTOMY N/A 02/25/2015   Procedure: LAPAROSCOPIC CHOLECYSTECTOMY WITH INTRAOPERATIVE CHOLANGIOGRAM;  Surgeon: Christene Lye, MD;  Location: [facility] ORS;  Service: General;  Laterality: N/A;  . ROTATOR CUFF REPAIR Left 07/2017  . SHOULDER ARTHROSCOPY Left 11/17/2015   Procedure: ARTHROSCOPY SHOULDER acrominal repair, decompression left ;  Surgeon: Corky Mull, MD;  Location: [facility] ORS;  Service: Orthopedics;  Laterality: Left;    Prior to Admission medications   Medication Sig Start Date End Date Taking? Authorizing Provider  Fluticasone-Umeclidin-Vilant (TRELEGY ELLIPTA) 100-62.5-25 MCG/INH AEPB Inhale into the lungs. 10/28/19  Yes [provider]  Cetirizine HCl (ZYRTEC  ALLERGY PO) Take 1 tablet by mouth daily as needed.    [provider]  Erenumab-aooe (AIMOVIG) 140 MG/ML SOAJ Inject into the skin. 03/03/19   [provider]  fluconazole (DIFLUCAN) 150 MG tablet Take 1 tablet (150 mg total) by mouth every three (3) days as needed. 05/24/20   Sharion Balloon, FNP  ibuprofen (ADVIL,MOTRIN) 600 MG tablet Take 1 tablet (600 mg total) by mouth every 8 (eight) hours as needed. 04/11/17   Darel Hong, MD  Lactobacillus (ACIDOPHILUS PO) Take by mouth.    [provider]  levonorgestrel (MIRENA) 20 MCG/24HR IUD 1 Intra Uterine Device (1 each total) by Intrauterine route once. 04/16/17 04/16/17  Will Bonnet, MD  meclizine (ANTIVERT) 25 MG tablet Take 25 mg by mouth 3 (three) times daily as needed for dizziness.    [provider]  Melatonin 5 MG TABS Take 5 mg by mouth daily.    [provider]  nitrofurantoin, macrocrystal-monohydrate, (MACROBID) 100 MG capsule Take 1 capsule (100 mg total) by mouth 2 (two) times daily. 06/16/19   Will Bonnet, MD  topiramate (TOPAMAX) 100 MG tablet Take 100 mg by mouth 2 (two) times daily.     [provider]     Allergies  Allergen Reactions  . Mupirocin Calcium   . Bacitracin Rash    Obstetric History: [id number]  Family History  Problem Relation Age of Onset  . Cancer Mother 56       colon  . Diabetes Father   . Stroke Father  Social History   Socioeconomic History  . Marital status: Married    Spouse name: Not on file  . Number of children: Not on file  . Years of education: Not on file  . Highest education level: Not on file  Occupational History  . Not on file  Tobacco Use  . Smoking status: Never Smoker  . Smokeless tobacco: Never Used  Vaping Use  . Vaping Use: Never used  Substance and Sexual Activity  . Alcohol use: Yes    Alcohol/week: 0.0 standard drinks    Comment: 1/day  . Drug use: No  . Sexual activity: Yes    Birth  control/protection: I.U.D.  Other Topics Concern  . Not on file  Social History Narrative  . Not on file   Social Determinants of Health   Financial Resource Strain:   . Difficulty of Paying Living Expenses: Not on file  Food Insecurity:   . Worried About Charity fundraiser in the Last Year: Not on file  . Ran Out of Food in the Last Year: Not on file  Transportation Needs:   . Lack of Transportation (Medical): Not on file  . Lack of Transportation (Non-Medical): Not on file  Physical Activity:   . Days of Exercise per Week: Not on file  . Minutes of Exercise per Session: Not on file  Stress:   . Feeling of Stress : Not on file  Social Connections:   . Frequency of Communication with Friends and Family: Not on file  . Frequency of Social Gatherings with Friends and Family: Not on file  . Attends Religious Services: Not on file  . Active Member of Clubs or Organizations: Not on file  . Attends Archivist Meetings: Not on file  . Marital Status: Not on file  Intimate Partner Violence:   . Fear of Current or Ex-Partner: Not on file  . Emotionally Abused: Not on file  . Physically Abused: Not on file  . Sexually Abused: Not on file    Review of Systems  Constitutional: Negative.   HENT: Negative.   Eyes: Negative.   Respiratory: Negative.   Cardiovascular: Negative.   Gastrointestinal: Negative.   Genitourinary: Negative.   Musculoskeletal: Negative.   Skin: Negative.   Neurological: Negative.   Psychiatric/Behavioral: Negative.      Physical Exam BP 122/80   Ht 5\' 4"  (1.626 m)   Wt 180 lb (81.6 kg)   BMI 30.90 kg/m   Physical Exam Constitutional:      General: She is not in acute distress.    Appearance: Normal appearance. She is well-developed.  Genitourinary:     Pelvic exam was performed with patient in the lithotomy position.     Vulva, urethra, bladder and uterus normal.     No inguinal adenopathy present in the right or left side.    No  signs of injury in the vagina.     No vaginal discharge, erythema, tenderness or bleeding.     No cervical motion tenderness, discharge, lesion or polyp.     No IUD strings visualized.     Uterus is mobile.     Uterus is not enlarged or tender.     No uterine mass detected.    Uterus is anteverted.     No right or left adnexal mass present.     Right adnexa not tender or full.     Left adnexa not tender or full.  HENT:     Head: Normocephalic  and atraumatic.  Eyes:     General: No scleral icterus.    Conjunctiva/sclera: Conjunctivae normal.  Neck:     Thyroid: No thyromegaly.  Cardiovascular:     Rate and Rhythm: Normal rate and regular rhythm.     Heart sounds: No murmur heard.  No friction rub. No gallop.   Pulmonary:     Effort: Pulmonary effort is normal. No respiratory distress.     Breath sounds: Normal breath sounds. No wheezing or rales.  Chest:     Breasts:        Right: No inverted nipple, mass, nipple discharge, skin change or tenderness.        Left: No inverted nipple, mass, nipple discharge, skin change or tenderness.  Abdominal:     General: Bowel sounds are normal. There is no distension.     Palpations: Abdomen is soft. There is no mass.     Tenderness: There is no abdominal tenderness. There is no guarding or rebound.  Musculoskeletal:        General: No swelling or tenderness. Normal range of motion.     Cervical back: Normal range of motion and neck supple.  Lymphadenopathy:     Cervical: No cervical adenopathy.     Lower Body: No right inguinal adenopathy. No left inguinal adenopathy.  Neurological:     General: No focal deficit present.     Mental Status: She is alert and oriented to person, place, and time.     Cranial Nerves: No cranial nerve deficit.  Skin:    General: Skin is warm and dry.     Findings: No erythema or rash.  Psychiatric:        Mood and Affect: Mood normal.        Behavior: Behavior normal.        Judgment: Judgment normal.      Female chaperone present for pelvic and breast  portions of the physical exam  Results: AUDIT Questionnaire (screen for alcoholism): 3 PHQ-9: 4  Assessment: 44 y.o. [id number] female here for routine gynecologic examination.  Plan: Problem List Items Addressed This Visit    None    Visit Diagnoses    Women's annual routine gynecological examination    -  Primary   Screening for depression       Screening for alcoholism          Screening: -- Blood pressure screen normal -- Colonoscopy - patient already arranging -- Mammogram - due. Patient to call [place] to arrange. She understands that it is her responsibility to arrange this. -- Weight screening: overweight: continue to monitor -- Depression screening negative (PHQ-9) -- Nutrition: normal -- cholesterol screening: not due for screening -- osteoporosis screening: not due -- tobacco screening: not using -- alcohol screening: AUDIT questionnaire indicates low-risk usage. -- family history of breast cancer screening: done. not at high risk. -- no evidence of domestic violence or intimate partner violence. -- STD screening: gonorrhea/chlamydia NAAT not collected per patient request. -- pap smear not collected per ASCCP guidelines -- flu vaccine will receive   -- Patient has received COVID19 vaccinations  Prentice Docker, MD 07/13/2020 2:14 PM

## 2020-08-02 ENCOUNTER — Other Ambulatory Visit: Payer: Self-pay | Admitting: Obstetrics and Gynecology

## 2020-08-02 DIAGNOSIS — Z1231 Encounter for screening mammogram for malignant neoplasm of breast: Secondary | ICD-10-CM

## 2020-09-08 ENCOUNTER — Ambulatory Visit
Admission: RE | Admit: 2020-09-08 | Discharge: 2020-09-08 | Disposition: A | Discharge status: Home / Self Care | Payer: 59 | Source: Ambulatory Visit | Attending: Obstetrics and Gynecology | Admitting: Obstetrics and Gynecology

## 2020-09-08 ENCOUNTER — Other Ambulatory Visit: Payer: Self-pay

## 2020-09-08 DIAGNOSIS — Z1231 Encounter for screening mammogram for malignant neoplasm of breast: Secondary | ICD-10-CM | POA: Diagnosis not present

## 2020-11-24 ENCOUNTER — Other Ambulatory Visit: Payer: Self-pay | Admitting: Gastroenterology

## 2020-11-28 LAB — SURGICAL PATHOLOGY

## 2020-12-05 ENCOUNTER — Telehealth: Payer: 59 | Admitting: Physician Assistant

## 2020-12-05 DIAGNOSIS — R300 Dysuria: Secondary | ICD-10-CM | POA: Diagnosis not present

## 2020-12-05 MED ORDER — CEPHALEXIN 500 MG PO CAPS: 500.0000 mg | ORAL_CAPSULE | Freq: Two times a day (BID) | ORAL | 0 refills | Status: AC

## 2020-12-05 NOTE — Progress Notes (Signed)
I have spent 5 minutes in review of e-visit questionnaire, review and updating patient chart, medical decision making and response to patient.   Aneyah Lortz Cody Madesyn Ast, PA-C    

## 2020-12-05 NOTE — Progress Notes (Signed)

## 2021-02-01 ENCOUNTER — Ambulatory Visit: Payer: 59 | Admitting: Dermatology

## 2021-02-01 ENCOUNTER — Other Ambulatory Visit: Payer: Self-pay

## 2021-02-01 ENCOUNTER — Encounter: Payer: Self-pay | Admitting: Dermatology

## 2021-02-01 DIAGNOSIS — L821 Other seborrheic keratosis: Secondary | ICD-10-CM

## 2021-02-01 DIAGNOSIS — D2362 Other benign neoplasm of skin of left upper limb, including shoulder: Secondary | ICD-10-CM | POA: Diagnosis not present

## 2021-02-01 DIAGNOSIS — D239 Other benign neoplasm of skin, unspecified: Secondary | ICD-10-CM

## 2021-02-01 DIAGNOSIS — Z1283 Encounter for screening for malignant neoplasm of skin: Secondary | ICD-10-CM | POA: Diagnosis not present

## 2021-02-01 DIAGNOSIS — D2371 Other benign neoplasm of skin of right lower limb, including hip: Secondary | ICD-10-CM

## 2021-02-01 DIAGNOSIS — D229 Melanocytic nevi, unspecified: Secondary | ICD-10-CM

## 2021-02-01 DIAGNOSIS — D2372 Other benign neoplasm of skin of left lower limb, including hip: Secondary | ICD-10-CM | POA: Diagnosis not present

## 2021-02-01 DIAGNOSIS — D492 Neoplasm of unspecified behavior of bone, soft tissue, and skin: Secondary | ICD-10-CM

## 2021-02-01 DIAGNOSIS — D2262 Melanocytic nevi of left upper limb, including shoulder: Secondary | ICD-10-CM

## 2021-02-01 DIAGNOSIS — L578 Other skin changes due to chronic exposure to nonionizing radiation: Secondary | ICD-10-CM

## 2021-02-01 DIAGNOSIS — L814 Other melanin hyperpigmentation: Secondary | ICD-10-CM

## 2021-02-01 DIAGNOSIS — D1800 Hemangioma unspecified site: Secondary | ICD-10-CM

## 2021-02-01 NOTE — Patient Instructions (Addendum)
Melanoma ABCDEs  Melanoma is the most dangerous type of skin cancer, and is the leading cause of death from skin disease.  You are more likely to develop melanoma if you:  Have light-colored skin, light-colored eyes, or red or blond hair  Spend a lot of time in the sun  Tan regularly, either outdoors or in a tanning bed  Have had blistering sunburns, especially during childhood  Have a close family member who has had a melanoma  Have atypical moles or large birthmarks  Early detection of melanoma is key since treatment is typically straightforward and cure rates are extremely high if we catch it early.   The first sign of melanoma is often a change in a mole or a new dark spot.  The ABCDE system is a way of remembering the signs of melanoma.  A for asymmetry:  The two halves do not match. B for border:  The edges of the growth are irregular. C for color:  A mixture of colors are present instead of an even brown color. D for diameter:  Melanomas are usually (but not always) greater than 90mm - the size of a pencil eraser. E for evolution:  The spot keeps changing in size, shape, and color.  Please check your skin once per month between visits. You can use a small mirror in front and a large mirror behind you to keep an eye on the back side or your body.   If you see any new or changing lesions before your next follow-up, please call to schedule a visit.  Please continue daily skin protection including broad spectrum sunscreen SPF 30+ to sun-exposed areas, reapplying every 2 hours as needed when you're outdoors.   Staying in the shade or wearing long sleeves, sun glasses (UVA+UVB protection) and wide brim hats (4-inch brim around the entire circumference of the hat) are also recommended for sun protection.   Wound Care Instructions  1. Cleanse wound gently with soap and water once a day then pat dry with clean gauze. Apply a thing coat of Petrolatum (petroleum jelly, "Vaseline") over  the wound (unless you have an allergy to this). We recommend that you use a new, sterile tube of Vaseline. Do not pick or remove scabs. Do not remove the yellow or white "healing tissue" from the base of the wound.  2. Cover the wound with fresh, clean, nonstick gauze and secure with paper tape. You may use Band-Aids in place of gauze and tape if the would is small enough, but would recommend trimming much of the tape off as there is often too much. Sometimes Band-Aids can irritate the skin.  3. You should call the office for your biopsy report after 1 week if you have not already been contacted.  4. If you experience any problems, such as abnormal amounts of bleeding, swelling, significant bruising, significant pain, or evidence of infection, please call the office immediately.  5. FOR ADULT SURGERY PATIENTS: If you need something for pain relief you may take 1 extra strength Tylenol (acetaminophen) AND 2 Ibuprofen (200mg  each) together every 4 hours as needed for pain. (do not take these if you are allergic to them or if you have a reason you should not take them.) Typically, you may only need pain medication for 1 to 3 days.    If you have any questions or concerns for your doctor, please call our main line at [phone] and press option 4 to reach your doctor's medical assistant. If no  one answers, please leave a voicemail as directed and we will return your call as soon as possible. Messages left after 4 pm will be answered the following business day.   You may also send [place] a message via [place]. We typically respond to MyChart messages within 1-2 business days.  For prescription refills, please ask your pharmacy to contact our office. Our fax number is [phone].  If you have an urgent issue when the clinic is closed that cannot wait until the next business day, you can page your doctor at the number below.    Please note that while we do our best to be available for urgent issues  outside of office hours, we are not available 24/7.   If you have an urgent issue and are unable to reach [place], you may choose to seek medical care at your doctor's office, retail clinic, urgent care center, or emergency room.  If you have a medical emergency, please immediately call 911 or go to the emergency department.  Pager Numbers  - Dr. Nehemiah Massed: [phone]  - Dr. Laurence Ferrari: [phone]  - Dr. Nicole Kindred: [phone]  In the event of inclement weather, please call our main line at [phone] for an update on the status of any delays or closures.  Dermatology Medication Tips: Please keep the boxes that topical medications come in in order to help keep track of the instructions about where and how to use these. Pharmacies typically print the medication instructions only on the boxes and not directly on the medication tubes.   If your medication is too expensive, please contact our office at [phone] option 4 or send [place] a message through [place].   We are unable to tell what your co-pay for medications will be in advance as this is different depending on your insurance coverage. However, we may be able to find a substitute medication at lower cost or fill out paperwork to get insurance to cover a needed medication.   If a prior authorization is required to get your medication covered by your insurance company, please allow [place] 1-2 business days to complete this process.  Drug prices often vary depending on where the prescription is filled and some pharmacies may offer cheaper prices.  The website www.goodrx.com contains coupons for medications through different pharmacies. The prices here do not account for what the cost may be with help from insurance (it may be cheaper with your insurance), but the website can give you the price if you did not use any insurance.  - You can print the associated coupon and take it with your prescription to the pharmacy.  - You may also stop by our  office during regular business hours and pick up a GoodRx coupon card.  - If you need your prescription sent electronically to a different pharmacy, notify our office through [facility] or by phone at [phone] option 4.

## 2021-02-01 NOTE — Progress Notes (Signed)
New Patient Visit  Subjective  Jenna White is a 45 y.o. female who presents for the following: Annual Exam (Here for skin cancer screening. No Hx of DN. /Check lesion on right thigh above knee. Dur: several years. Slowly growing. Itches at times. Cuts when shaving. ). The patient presents for Total-Body Skin Exam (TBSE) for skin cancer screening and mole check.  Review of Systems: No other skin or systemic complaints except as noted in HPI or Assessment and Plan.  Objective  Well appearing patient in no apparent distress; mood and affect are within normal limits.  A full examination was performed including scalp, head, eyes, ears, nose, lips, neck, chest, axillae, abdomen, back, buttocks, bilateral upper extremities, bilateral lower extremities, hands, feet, fingers, toes, fingernails, and toenails. All findings within normal limits unless otherwise noted below.  Objective  Right thigh above knee, left posterior deltoid, left lateral calf: 0.8cm Firm pink/brown papulenodule at right thigh above the knee  Objective  Left Hip (side) - Posterior: Slightly irregular nevi at back, abdomen and buttocks.   Images            Objective  Right Thigh  above knee: 0.8cm pink brown firm papule  Assessment & Plan    Lentigines - Scattered tan macules - Due to sun exposure - Benign-appering, observe - Recommend daily broad spectrum sunscreen SPF 30+ to sun-exposed areas, reapply every 2 hours as needed. - Call for any changes  Seborrheic Keratoses - Stuck-on, waxy, tan-brown papules and/or plaques  - Benign-appearing - Discussed benign etiology and prognosis. - Observe - Call for any changes  Melanocytic Nevi -many are slightly irregular on exam today. - Tan-brown and/or pink-flesh-colored symmetric macules and papules - Benign appearing on exam today - Observation - Call clinic for new or changing moles - Recommend daily use of broad spectrum spf 30+ sunscreen to  sun-exposed areas.   Hemangiomas - Red papules - Discussed benign nature - Observe - Call for any changes  Actinic Damage - Chronic condition, secondary to cumulative UV/sun exposure - diffuse scaly erythematous macules with underlying dyspigmentation - Recommend daily broad spectrum sunscreen SPF 30+ to sun-exposed areas, reapply every 2 hours as needed.  - Staying in the shade or wearing long sleeves, sun glasses (UVA+UVB protection) and wide brim hats (4-inch brim around the entire circumference of the hat) are also recommended for sun protection.  - Call for new or changing lesions.  Skin cancer screening performed today.  Dermatofibroma Right thigh above knee, left posterior deltoid, left lateral calf Benign-appearing.  Observation.  Call clinic for new or changing lesions.  Recommend daily use of broad spectrum spf 30+ sunscreen to sun-exposed areas.   Nevus Left Hip (side) - Posterior Photo's taken today. Plan to watch.  Neoplasm of skin Right Thigh  above knee Epidermal / dermal shaving  Lesion diameter (cm):  0.8 Informed consent: discussed and consent obtained   Timeout: patient name, date of birth, surgical site, and procedure verified   Procedure prep:  Patient was prepped and draped in usual sterile fashion Prep type:  Isopropyl alcohol Anesthesia: the lesion was anesthetized in a standard fashion   Anesthetic:  1% lidocaine w/ epinephrine 1-100,000 buffered w/ 8.4% NaHCO3 Instrument used: flexible razor blade   Hemostasis achieved with: pressure, aluminum chloride and electrodesiccation   Outcome: patient tolerated procedure well   Post-procedure details: sterile dressing applied and wound care instructions given   Dressing type: bandage and petrolatum    Other Related Procedures Anatomic  Pathology Report  Return in about 1 year (around 02/01/2022) for TBSE.   I, Emelia Salisbury, CMA, am acting as scribe for Sarina Ser, MD. Documentation: I have reviewed  the above documentation for accuracy and completeness, and I agree with the above.  Sarina Ser, MD

## 2021-02-03 ENCOUNTER — Encounter: Payer: Self-pay | Admitting: Dermatology

## 2021-02-07 LAB — ANATOMIC PATHOLOGY REPORT

## 2021-02-08 ENCOUNTER — Telehealth: Payer: Self-pay

## 2021-02-08 NOTE — Telephone Encounter (Signed)
Left message for patient to call office for results/hd 

## 2021-02-08 NOTE — Telephone Encounter (Signed)
-----   Message from Ralene Bathe, MD sent at 02/07/2021 12:02 PM EDT ----- Diagnosis synopsis: Comment  Comment: Specimen 1-Skin Biopsy, Right Thigh Above Knee: SURFACE OF  DERMATOFIBROMA (BENIGN FIBROUS HISTIOCYTOMA).   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

## 2021-02-21 ENCOUNTER — Telehealth: Payer: Self-pay

## 2021-02-21 NOTE — Telephone Encounter (Signed)
Patient informed of pathology results 

## 2021-02-21 NOTE — Telephone Encounter (Signed)
-----   Message from Ralene Bathe, MD sent at 02/07/2021 12:02 PM EDT ----- Diagnosis synopsis: Comment  Comment: Specimen 1-Skin Biopsy, Right Thigh Above Knee: SURFACE OF  DERMATOFIBROMA (BENIGN FIBROUS HISTIOCYTOMA).   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

## 2021-04-09 ENCOUNTER — Telehealth: Payer: 59 | Admitting: Family Medicine

## 2021-04-09 DIAGNOSIS — J029 Acute pharyngitis, unspecified: Secondary | ICD-10-CM

## 2021-04-09 NOTE — Progress Notes (Signed)
[name], Jenna White are scheduled for a virtual visit with your provider today.    Just as we do with appointments in the office, we must obtain your consent to participate.  Your consent will be active for this visit and any virtual visit you may have with one of our providers in the next 365 days.    If you have a MyChart account, I can also send a copy of this consent to you electronically.  All virtual visits are billed to your insurance company just like a traditional visit in the office.  As this is a virtual visit, video technology does not allow for your provider to perform a traditional examination.  This may limit your provider's ability to fully assess your condition.  If your provider identifies any concerns that need to be evaluated in person or the need to arrange testing such as labs, EKG, etc, we will make arrangements to do so.    Although advances in technology are sophisticated, we cannot ensure that it will always work on either your end or our end.  If the connection with a video visit is poor, we may have to switch to a telephone visit.  With either a video or telephone visit, we are not always able to ensure that we have a secure connection.   I need to obtain your verbal consent now.   Are you willing to proceed with your visit today?   Jenna White has provided verbal consent on 04/09/2021 for a virtual visit (video or telephone).   E-Visit for Sore Throat  We are sorry that you are not feeling well.  Here is how we plan to help!  Your symptoms indicate a likely viral infection (Pharyngitis).   Pharyngitis is inflammation in the back of the throat which can cause a sore throat, scratchiness and sometimes difficulty swallowing.   Pharyngitis is typically caused by a respiratory virus and will just run its course.  Please keep in mind that your symptoms could last up to 10 days.  For throat pain, we recommend over the counter oral pain relief medications such as acetaminophen or  aspirin, or anti-inflammatory medications such as ibuprofen or naproxen sodium.  Topical treatments such as oral throat lozenges or sprays may be used as needed.  Avoid close contact with loved ones, especially the very young and elderly.  Remember to wash your hands thoroughly throughout the day as this is the number one way to prevent the spread of infection and wipe down door knobs and counters with disinfectant.  After careful review of your answers, I would not recommend and antibiotic for your condition.  Antibiotics should not be used to treat conditions that we suspect are caused by viruses like the virus that causes the common cold or flu. However, some people can have Strep with atypical symptoms. You may need formal testing in clinic or office to confirm if your symptoms continue or worsen.  Providers prescribe antibiotics to treat infections caused by bacteria. Antibiotics are very powerful in treating bacterial infections when they are used properly.  To maintain their effectiveness, they should be used only when necessary.  Overuse of antibiotics has resulted in the development of super bugs that are resistant to treatment!    Home Care: Only take medications as instructed by your medical team. Do not drink alcohol while taking these medications. A steam or ultrasonic humidifier can help congestion.  You can place a towel over your head and breathe in the steam  from hot water coming from a faucet. Avoid close contacts especially the very young and the elderly. Cover your mouth when you cough or sneeze. Always remember to wash your hands.  Get Help Right Away If: You develop worsening fever or throat pain. You develop a severe head ache or visual changes. Your symptoms persist after you have completed your treatment plan.  Make sure you Understand these instructions. Will watch your condition. Will get help right away if you are not doing well or get worse.   Thank you for  choosing an e-visit.  Your e-visit answers were reviewed by a board certified advanced clinical practitioner to complete your personal care plan. Depending upon the condition, your plan could have included both over the counter or prescription medications.  Please review your pharmacy choice. Make sure the pharmacy is open so you can pick up prescription now. If there is a problem, you may contact your provider through [employer] and have the prescription routed to another pharmacy.  Your safety is important to [place]. If you have drug allergies check your prescription carefully.   For the next 24 hours you can use MyChart to ask questions about today's visit, request a non-urgent call back, or ask for a work or school excuse. You will get an email in the next two days asking about your experience. I hope that your e-visit has been valuable and will speed your recovery.  I provided 10 minutes of non face-to-face time during this encounter for chart review, medication and order placement, as well as and documentation.      Jenna Mayo, NP 04/09/2021  12:31 PM

## 2021-06-29 ENCOUNTER — Other Ambulatory Visit: Payer: Self-pay | Admitting: Obstetrics and Gynecology

## 2021-06-29 DIAGNOSIS — Z1231 Encounter for screening mammogram for malignant neoplasm of breast: Secondary | ICD-10-CM

## 2021-07-17 ENCOUNTER — Encounter: Payer: Self-pay | Admitting: Obstetrics and Gynecology

## 2021-07-17 ENCOUNTER — Other Ambulatory Visit: Payer: Self-pay

## 2021-07-17 ENCOUNTER — Ambulatory Visit (INDEPENDENT_AMBULATORY_CARE_PROVIDER_SITE_OTHER): Payer: 59 | Admitting: Obstetrics and Gynecology

## 2021-07-17 ENCOUNTER — Ambulatory Visit: Payer: 59 | Admitting: Obstetrics and Gynecology

## 2021-07-17 VITALS — BP 118/74 | Ht 64.0 in | Wt 188.0 lb

## 2021-07-17 DIAGNOSIS — Z1331 Encounter for screening for depression: Secondary | ICD-10-CM | POA: Diagnosis not present

## 2021-07-17 DIAGNOSIS — R232 Flushing: Secondary | ICD-10-CM

## 2021-07-17 DIAGNOSIS — Z01419 Encounter for gynecological examination (general) (routine) without abnormal findings: Secondary | ICD-10-CM | POA: Diagnosis not present

## 2021-07-17 DIAGNOSIS — Z1339 Encounter for screening examination for other mental health and behavioral disorders: Secondary | ICD-10-CM | POA: Diagnosis not present

## 2021-07-17 MED ORDER — PAROXETINE HCL 10 MG PO TABS: 10.0000 mg | ORAL_TABLET | Freq: Every day | ORAL | 4 refills | Status: AC

## 2021-07-17 NOTE — Progress Notes (Signed)
Gynecology Annual Exam  PCP: Roselee Nova, MD  Chief Complaint  Patient presents with   Annual Exam   History of Present Illness:  Ms. CAMBRE MATSON is a 45 y.o. [phone] who LMP was No LMP recorded. (Menstrual status: IUD)., presents today for her annual examination.  IUD placed in mid 2018 (Mirena)  She does have vasomotor sx. She occasionally has hot flashes, poor sleeping, is easily annoyed, low sex drive.  None of this has changed over the past year.   She is sexually active. She does have vaginal dryness. There is no pain, though.   Last Pap: 05/29/2019  Results were: no abnormalities,  HPV DNA negative Hx of STDs: none  Last mammogram: 1 year ago  Results were: normal--routine follow-up in 12 months There is no FH of breast cancer. There is no FH of ovarian cancer. The patient does do self-breast exams.  Colonoscopy: done this year. Follow up in 5 years.Marland Kitchen DEXA: has not been screened for osteoporosis  Tobacco use: The patient denies current or previous tobacco use. Alcohol use: social drinker Exercise: not as much as she'd like  The patient wears seatbelts: yes.     Past Medical History:  Diagnosis Date   Allergy    Asthma    GERD (gastroesophageal reflux disease)    Migraine    Migraines     Past Surgical History:  Procedure Laterality Date   CHOLECYSTECTOMY N/A 02/25/2015   Procedure: LAPAROSCOPIC CHOLECYSTECTOMY WITH INTRAOPERATIVE CHOLANGIOGRAM;  Surgeon: Christene Lye, MD;  Location: [facility] ORS;  Service: General;  Laterality: N/A;   ROTATOR CUFF REPAIR Left 07/2017   SHOULDER ARTHROSCOPY Left 11/17/2015   Procedure: ARTHROSCOPY SHOULDER acrominal repair, decompression left ;  Surgeon: Corky Mull, MD;  Location: [facility] ORS;  Service: Orthopedics;  Laterality: Left;    Prior to Admission medications   Medication Sig Start Date End Date Taking? Authorizing Provider  Fluticasone-Umeclidin-Vilant (TRELEGY ELLIPTA) 100-62.5-25 MCG/INH AEPB Inhale into  the lungs. 10/28/19  Yes [provider]  Cetirizine HCl (ZYRTEC ALLERGY PO) Take 1 tablet by mouth daily as needed.    [provider]  Erenumab-aooe (AIMOVIG) 140 MG/ML SOAJ Inject into the skin. 03/03/19   [provider]  ibuprofen (ADVIL,MOTRIN) 600 MG tablet Take 1 tablet (600 mg total) by mouth every 8 (eight) hours as needed. 04/11/17   Darel Hong, MD  Lactobacillus (ACIDOPHILUS PO) Take by mouth.    [provider]  levonorgestrel (MIRENA) 20 MCG/24HR IUD 1 Intra Uterine Device (1 each total) by Intrauterine route once. 04/16/17 04/16/17  Will Bonnet, MD  meclizine (ANTIVERT) 25 MG tablet Take 25 mg by mouth 3 (three) times daily as needed for dizziness.    [provider]  Omeprazole 20 mg daily PO for heartburn  Allergies  Allergen Reactions   Mupirocin Calcium    Bacitracin Rash   Obstetric History: [id number]  Family History  Problem Relation Age of Onset   Cancer Mother 55       colon   Diabetes Father    Stroke Father     Social History   Socioeconomic History   Marital status: Married    Spouse name: Not on file   Number of children: Not on file   Years of education: Not on file   Highest education level: Not on file  Occupational History   Not on file  Tobacco Use   Smoking status: Never   Smokeless tobacco: Never  Vaping Use  Vaping Use: Never used  Substance and Sexual Activity   Alcohol use: Yes    Alcohol/week: 0.0 standard drinks    Comment: 1/day   Drug use: No   Sexual activity: Yes    Birth control/protection: I.U.D.  Other Topics Concern   Not on file  Social History Narrative   Not on file   Social Determinants of Health   Financial Resource Strain: Not on file  Food Insecurity: Not on file  Transportation Needs: Not on file  Physical Activity: Not on file  Stress: Not on file  Social Connections: Not on file  Intimate Partner Violence: Not on file    Review of Systems   Constitutional: Negative.   HENT: Negative.    Eyes: Negative.   Respiratory: Negative.    Cardiovascular: Negative.   Gastrointestinal: Negative.   Genitourinary: Negative.   Musculoskeletal: Negative.   Skin: Negative.   Neurological: Negative.   Psychiatric/Behavioral: Negative.      Physical Exam BP 118/74   Ht 5\' 4"  (1.626 m)   Wt 188 lb (85.3 kg)   BMI 32.27 kg/m   Physical Exam Constitutional:      General: She is not in acute distress.    Appearance: Normal appearance. She is well-developed.  Genitourinary:     Vulva and bladder normal.     No vaginal discharge, erythema, tenderness or bleeding.      Right Adnexa: not tender, not full and no mass present.    Left Adnexa: not tender, not full and no mass present.    No cervical motion tenderness, discharge, lesion or polyp.     No IUD strings visualized.     Uterus is not enlarged or tender.     No uterine mass detected.    Pelvic exam was performed with patient in the lithotomy position.  Breasts:    Right: No inverted nipple, mass, nipple discharge, skin change or tenderness.     Left: No inverted nipple, mass, nipple discharge, skin change or tenderness.  HENT:     Head: Normocephalic and atraumatic.  Eyes:     General: No scleral icterus.    Conjunctiva/sclera: Conjunctivae normal.  Neck:     Thyroid: No thyromegaly.  Cardiovascular:     Rate and Rhythm: Normal rate and regular rhythm.     Heart sounds: No murmur heard.   No friction rub. No gallop.  Pulmonary:     Effort: Pulmonary effort is normal. No respiratory distress.     Breath sounds: Normal breath sounds. No wheezing or rales.  Abdominal:     General: Bowel sounds are normal. There is no distension.     Palpations: Abdomen is soft. There is no mass.     Tenderness: There is no abdominal tenderness. There is no guarding or rebound.  Musculoskeletal:        General: No swelling or tenderness. Normal range of motion.     Cervical back:  Normal range of motion and neck supple.  Lymphadenopathy:     Cervical: No cervical adenopathy.     Lower Body: No right inguinal adenopathy. No left inguinal adenopathy.  Neurological:     General: No focal deficit present.     Mental Status: She is alert and oriented to person, place, and time.     Cranial Nerves: No cranial nerve deficit.  Skin:    General: Skin is warm and dry.     Findings: No erythema or rash.  Psychiatric:  Mood and Affect: Mood normal.        Behavior: Behavior normal.        Judgment: Judgment normal.    Female chaperone present for pelvic and breast  portions of the physical exam  Results: AUDIT Questionnaire (screen for alcoholism): 4 PHQ-9: 2  Assessment: 45 y.o. [id number] female here for routine gynecologic examination.  Plan: Problem List Items Addressed This Visit   None Visit Diagnoses     Women's annual routine gynecological examination    -  Primary   Screening for depression       Screening for alcoholism       Hot flashes       Relevant Medications   PARoxetine (PAXIL) 10 MG tablet      Screening: -- Blood pressure screen normal -- Colonoscopy - not due -- Mammogram - due. Patient to call [place] to arrange. She understands that it is her responsibility to arrange this. She is already scheduled for December.  -- Weight screening: overweight: continue to monitor -- Depression screening negative (PHQ-9) -- Nutrition: normal -- cholesterol screening: not due for screening -- osteoporosis screening: not due -- tobacco screening: not using -- alcohol screening: AUDIT questionnaire indicates low-risk usage. -- family history of breast cancer screening: done. not at high risk. -- no evidence of domestic violence or intimate partner violence. -- STD screening: gonorrhea/chlamydia NAAT not collected per patient request. -- pap smear not collected per ASCCP guidelines -- flu vaccine  will receive    -- Patient has received  COVID19 vaccinations  Prentice Docker, MD 07/17/2021 11:29 AM

## 2021-09-11 ENCOUNTER — Ambulatory Visit
Admission: RE | Admit: 2021-09-11 | Discharge: 2021-09-11 | Disposition: A | Discharge status: Home / Self Care | Payer: 59 | Source: Ambulatory Visit | Attending: Obstetrics and Gynecology | Admitting: Obstetrics and Gynecology

## 2021-09-11 ENCOUNTER — Other Ambulatory Visit: Payer: Self-pay

## 2021-09-11 DIAGNOSIS — Z1231 Encounter for screening mammogram for malignant neoplasm of breast: Secondary | ICD-10-CM | POA: Diagnosis present

## 2022-01-09 ENCOUNTER — Telehealth: Payer: Self-pay

## 2022-01-09 NOTE — Telephone Encounter (Signed)
Patient was scheduled to see Dr. Nehemiah Massed 02/08/22 but he will be out of the office. Appointment moved to 02/28/22 at 3 pm, left pt VM and sent msg via MyChart advising appt date and time change. ?Lurlean Horns., RMA ?

## 2022-02-08 ENCOUNTER — Encounter: Payer: 59 | Admitting: Dermatology

## 2022-02-22 ENCOUNTER — Ambulatory Visit: Payer: 59 | Admitting: Dermatology

## 2022-02-22 DIAGNOSIS — D225 Melanocytic nevi of trunk: Secondary | ICD-10-CM

## 2022-02-22 DIAGNOSIS — L578 Other skin changes due to chronic exposure to nonionizing radiation: Secondary | ICD-10-CM

## 2022-02-22 DIAGNOSIS — D2372 Other benign neoplasm of skin of left lower limb, including hip: Secondary | ICD-10-CM | POA: Diagnosis not present

## 2022-02-22 DIAGNOSIS — L821 Other seborrheic keratosis: Secondary | ICD-10-CM

## 2022-02-22 DIAGNOSIS — D489 Neoplasm of uncertain behavior, unspecified: Secondary | ICD-10-CM

## 2022-02-22 DIAGNOSIS — D2261 Melanocytic nevi of right upper limb, including shoulder: Secondary | ICD-10-CM

## 2022-02-22 DIAGNOSIS — L814 Other melanin hyperpigmentation: Secondary | ICD-10-CM

## 2022-02-22 DIAGNOSIS — Z1283 Encounter for screening for malignant neoplasm of skin: Secondary | ICD-10-CM | POA: Diagnosis not present

## 2022-02-22 DIAGNOSIS — D229 Melanocytic nevi, unspecified: Secondary | ICD-10-CM

## 2022-02-22 DIAGNOSIS — D239 Other benign neoplasm of skin, unspecified: Secondary | ICD-10-CM

## 2022-02-22 DIAGNOSIS — D2371 Other benign neoplasm of skin of right lower limb, including hip: Secondary | ICD-10-CM

## 2022-02-22 DIAGNOSIS — D2362 Other benign neoplasm of skin of left upper limb, including shoulder: Secondary | ICD-10-CM

## 2022-02-22 DIAGNOSIS — D1800 Hemangioma unspecified site: Secondary | ICD-10-CM

## 2022-02-22 NOTE — Progress Notes (Signed)
Follow-Up Visit   Subjective  TOI Jenna White is a 46 y.o. female who presents for the following: Annual Exam (1 year tbse, 1 mole that itchy at right axilla/flank area, patient reports dermatofibroma grew back at the right leg above knee. ). The patient presents for Total-Body Skin Exam (TBSE) for skin cancer screening and mole check.  The patient has spots, moles and lesions to be evaluated, some may be new or changing and the patient has concerns that these could be cancer.  The following portions of the chart were reviewed this encounter and updated as appropriate:  Tobacco  Allergies  Meds  Problems  Med Hx  Surg Hx  Fam Hx     Review of Systems: No other skin or systemic complaints except as noted in HPI or Assessment and Plan.  Objective  Well appearing patient in no apparent distress; mood and affect are within normal limits.  A full examination was performed including scalp, head, eyes, ears, nose, lips, neck, chest, axillae, abdomen, back, buttocks, bilateral upper extremities, bilateral lower extremities, hands, feet, fingers, toes, fingernails, and toenails. All findings within normal limits unless otherwise noted below.  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 Epidermal / dermal shaving  Lesion diameter (cm):  0.4 Informed consent: discussed and consent obtained   Timeout: patient name, date of birth, surgical site, and procedure verified   Procedure prep:  Patient was prepped and draped in  usual sterile fashion Prep type:  Isopropyl alcohol Anesthesia: the lesion was anesthetized in a standard fashion   Anesthetic:  1% lidocaine w/ epinephrine 1-100,000 buffered w/ 8.4% NaHCO3 Instrument used: flexible razor blade   Hemostasis achieved with: pressure, aluminum chloride and electrodesiccation   Outcome: patient tolerated procedure well   Post-procedure details: sterile dressing applied and wound care instructions given   Dressing type: bandage and petrolatum    Anatomic Pathology Report Irritating to patient R/o irritated nevus vs dysplasia  [facility]  Nevus (5) right upper quadrant abdomen; left lateral abdomen above waist; right lower quadrant abdomen; left posterior flank; left posterior flank medial See previous photos Benign-appearing.  Observation.  Call clinic for new or changing lesions.  Recommend daily use of broad spectrum spf 30+ sunscreen to sun-exposed areas.   Dermatofibroma right leg above knee Previously bx 5/22  Grew back  Recommend if becomes bothersome recommend excision  Lentigines - Scattered tan macules - Due to sun exposure - Benign-appearing, observe - Recommend daily broad spectrum sunscreen SPF 30+ to sun-exposed areas, reapply every 2 hours as needed. - Call for any changes  Dermatofibroma - Firm pink/brown papulenodule with dimple sign left posterior deltoid and left lateral calf  - Benign appearing - Call for any changes  Seborrheic Keratoses - Stuck-on, waxy, tan-brown papules and/or plaques  - Benign-appearing - Discussed benign etiology and prognosis. - Observe - Call for any changes  Melanocytic Nevi - Tan-brown and/or pink-flesh-colored symmetric macules and papules - Benign appearing  on exam today - Observation - Call clinic for new or changing moles - Recommend daily use of broad spectrum spf 30+ sunscreen to sun-exposed areas.   Hemangiomas - Red papules - Discussed benign nature - Observe - Call for any  changes  Actinic Damage - Chronic condition, secondary to cumulative UV/sun exposure - diffuse scaly erythematous macules with underlying dyspigmentation - Recommend daily broad spectrum sunscreen SPF 30+ to sun-exposed areas, reapply every 2 hours as needed.  - Staying in the shade or wearing long sleeves, sun glasses (UVA+UVB protection) and wide brim hats (4-inch brim around the entire circumference of the hat) are also recommended for sun protection.  - Call for new or changing lesions.  Skin cancer screening performed today. Return in about 1 year (around 02/23/2023) for TBSE. IRuthell Rummage, CMA, am acting as scribe for Sarina Ser, MD. Documentation: I have reviewed the above documentation for accuracy and completeness, and I agree with the above.  Sarina Ser, MD

## 2022-02-22 NOTE — Patient Instructions (Addendum)
°Biopsy Wound Care Instructions ° °Leave the original bandage on for 24 hours if possible.  If the bandage becomes soaked or soiled before that time, it is OK to remove it and examine the wound.  A small amount of post-operative bleeding is normal.  If excessive bleeding occurs, remove the bandage, place gauze over the site and apply continuous pressure (no peeking) over the area for 30 minutes. If this does not work, please call our clinic as soon as possible or page your doctor if it is after hours.  ° °Once a day, cleanse the wound with soap and water. It is fine to shower. If a thick crust develops you may use a Q-tip dipped into dilute hydrogen peroxide (mix 1:1 with water) to dissolve it.  Hydrogen peroxide can slow the healing process, so use it only as needed.   ° °After washing, apply petroleum jelly (Vaseline) or an antibiotic ointment if your doctor prescribed one for you, followed by a bandage.   ° °For best healing, the wound should be covered with a layer of ointment at all times. If you are not able to keep the area covered with a bandage to hold the ointment in place, this may mean re-applying the ointment several times a day.  Continue this wound care until the wound has healed and is no longer open.  ° °Itching and mild discomfort is normal during the healing process. However, if you develop pain or severe itching, please call our office.  ° °If you have any discomfort, you can take Tylenol (acetaminophen) or ibuprofen as directed on the bottle. (Please do not take these if you have an allergy to them or cannot take them for another reason). ° °Some redness, tenderness and white or yellow material in the wound is normal healing.  If the area becomes very sore and red, or develops a thick yellow-green material (pus), it may be infected; please notify us.   ° °If you have stitches, return to clinic as directed to have the stitches removed. You will continue wound care for 2-3 days after the stitches  are removed.  ° °Wound healing continues for up to one year following surgery. It is not unusual to experience pain in the scar from time to time during the interval.  If the pain becomes severe or the scar thickens, you should notify the office.   ° °A slight amount of redness in a scar is expected for the first six months.  After six months, the redness will fade and the scar will soften and fade.  The color difference becomes less noticeable with time.  If there are any problems, return for a post-op surgery check at your earliest convenience. ° °To improve the appearance of the scar, you can use silicone scar gel, cream, or sheets (such as Mederma or Serica) every night for up to one year. These are available over the counter (without a prescription). ° °Please call our office at [phone] for any questions or concerns. ° ° ° ° ° ° ° Melanoma ABCDEs ° °Melanoma is the most dangerous type of skin cancer, and is the leading cause of death from skin disease.  You are more likely to develop melanoma if you: °Have light-colored skin, light-colored eyes, or red or blond hair °Spend a lot of time in the sun °Tan regularly, either outdoors or in a tanning bed °Have had blistering sunburns, especially during childhood °Have a close family member who has had a melanoma °Have atypical   moles or large birthmarks ° °Early detection of melanoma is key since treatment is typically straightforward and cure rates are extremely high if we catch it early.  ° °The first sign of melanoma is often a change in a mole or a new dark spot.  The ABCDE system is a way of remembering the signs of melanoma. ° °A for asymmetry:  The two halves do not match. °B for border:  The edges of the growth are irregular. °C for color:  A mixture of colors are present instead of an even brown color. °D for diameter:  Melanomas are usually (but not always) greater than 6mm - the size of a pencil eraser. °E for evolution:  The spot keeps changing in  size, shape, and color. ° °Please check your skin once per month between visits. You can use a small mirror in front and a large mirror behind you to keep an eye on the back side or your body.  ° °If you see any new or changing lesions before your next follow-up, please call to schedule a visit. ° °Please continue daily skin protection including broad spectrum sunscreen SPF 30+ to sun-exposed areas, reapplying every 2 hours as needed when you're outdoors.  ° °Staying in the shade or wearing long sleeves, sun glasses (UVA+UVB protection) and wide brim hats (4-inch brim around the entire circumference of the hat) are also recommended for sun protection.   ° °If You Need Anything After Your Visit ° °If you have any questions or concerns for your doctor, please call our main line at [phone] and press option 4 to reach your doctor's medical assistant. If no one answers, please leave a voicemail as directed and we will return your call as soon as possible. Messages left after 4 pm will be answered the following business day.  ° °You may also send us a message via MyChart. We typically respond to MyChart messages within 1-2 business days. ° °For prescription refills, please ask your pharmacy to contact our office. Our fax number is [phone]. ° °If you have an urgent issue when the clinic is closed that cannot wait until the next business day, you can page your doctor at the number below.   ° °Please note that while we do our best to be available for urgent issues outside of office hours, we are not available 24/7.  ° °If you have an urgent issue and are unable to reach us, you may choose to seek medical care at your doctor's office, retail clinic, urgent care center, or emergency room. ° °If you have a medical emergency, please immediately call 911 or go to the emergency department. ° °Pager Numbers ° °- Dr. Kowalski: [phone] ° °- Dr. Moye: [phone] ° °- Dr. Stewart: [phone] ° °In the event of  inclement weather, please call our main line at [phone] for an update on the status of any delays or closures. ° °Dermatology Medication Tips: °Please keep the boxes that topical medications come in in order to help keep track of the instructions about where and how to use these. Pharmacies typically print the medication instructions only on the boxes and not directly on the medication tubes.  ° °If your medication is too expensive, please contact our office at [phone] option 4 or send us a message through MyChart.  ° °We are unable to tell what your co-pay for medications will be in advance as this is different depending on your insurance coverage. However, we may be able to   find a substitute medication at lower cost or fill out paperwork to get insurance to cover a needed medication.  ° °If a prior authorization is required to get your medication covered by your insurance company, please allow us 1-2 business days to complete this process. ° °Drug prices often vary depending on where the prescription is filled and some pharmacies may offer cheaper prices. ° °The website www.goodrx.com contains coupons for medications through different pharmacies. The prices here do not account for what the cost may be with help from insurance (it may be cheaper with your insurance), but the website can give you the price if you did not use any insurance.  °- You can print the associated coupon and take it with your prescription to the pharmacy.  °- You may also stop by our office during regular business hours and pick up a GoodRx coupon card.  °- If you need your prescription sent electronically to a different pharmacy, notify our office through [facility] or by phone at [phone] option 4. ° ° ° ° °Si Usted Necesita Algo Después de Su Visita ° °También puede enviarnos un mensaje a través de MyChart. Por lo general respondemos a los mensajes de MyChart en el transcurso de 1 a 2 días hábiles. ° °Para renovar  recetas, por favor pida a su farmacia que se ponga en contacto con nuestra oficina. Nuestro número de fax es el [phone]. ° °Si tiene un asunto urgente cuando la clínica esté cerrada y que no puede esperar hasta el siguiente día hábil, puede llamar/localizar a su doctor(a) al número que aparece a continuación.  ° °Por favor, tenga en cuenta que aunque hacemos todo lo posible para estar disponibles para asuntos urgentes fuera del horario de oficina, no estamos disponibles las 24 horas del día, los 7 días de la semana.  ° °Si tiene un problema urgente y no puede comunicarse con nosotros, puede optar por buscar atención médica  en el consultorio de su doctor(a), en una clínica privada, en un centro de atención urgente o en una sala de emergencias. ° °Si tiene una emergencia médica, por favor llame inmediatamente al 911 o vaya a la sala de emergencias. ° °Números de bíper ° °- Dr. Kowalski: [phone] ° °- Dra. Moye: [phone] ° °- Dra. Stewart: [phone] ° °En caso de inclemencias del tiempo, por favor llame a nuestra línea principal al [phone] para una actualización sobre el estado de cualquier retraso o cierre. ° °Consejos para la medicación en dermatología: °Por favor, guarde las cajas en las que vienen los medicamentos de uso tópico para ayudarle a seguir las instrucciones sobre dónde y cómo usarlos. Las farmacias generalmente imprimen las instrucciones del medicamento sólo en las cajas y no directamente en los tubos del medicamento.  ° °Si su medicamento es muy caro, por favor, póngase en contacto con nuestra oficina llamando al [phone] y presione la opción 4 o envíenos un mensaje a través de MyChart.  ° °No podemos decirle cuál será su copago por los medicamentos por adelantado ya que esto es diferente dependiendo de la cobertura de su seguro. Sin embargo, es posible que podamos encontrar un medicamento sustituto a menor costo o llenar un formulario para que el seguro cubra el medicamento  que se considera necesario.  ° °Si se requiere una autorización previa para que su compañía de seguros cubra su medicamento, por favor permítanos de 1 a 2 días hábiles para completar este proceso. ° °Los precios de los medicamentos varían con frecuencia dependiendo   del lugar de dónde se surte la receta y alguna farmacias pueden ofrecer precios más baratos. ° °El sitio web www.goodrx.com tiene cupones para medicamentos de diferentes farmacias. Los precios aquí no tienen en cuenta lo que podría costar con la ayuda del seguro (puede ser más barato con su seguro), pero el sitio web puede darle el precio si no utilizó ningún seguro.  °- Puede imprimir el cupón correspondiente y llevarlo con su receta a la farmacia.  °- También puede pasar por nuestra oficina durante el horario de atención regular y recoger una tarjeta de cupones de GoodRx.  °- Si necesita que su receta se envíe electrónicamente a una farmacia diferente, informe a nuestra oficina a través de MyChart de [place] o por teléfono llamando al [phone] y presione la opción 4. ° °

## 2022-02-26 ENCOUNTER — Encounter: Payer: Self-pay | Admitting: Dermatology

## 2022-02-28 ENCOUNTER — Ambulatory Visit: Payer: 59 | Admitting: Dermatology

## 2022-03-01 LAB — ANATOMIC PATHOLOGY REPORT

## 2022-03-05 ENCOUNTER — Telehealth: Payer: Self-pay

## 2022-03-05 NOTE — Telephone Encounter (Signed)
-----   Message from Ralene Bathe, MD sent at 03/01/2022  6:08 PM EDT ----- Diagnosis: Comment  Comment: Specimen A-COMPOUND NEVUS  Right anterior axilla Benign mole No further treatment needed

## 2022-03-05 NOTE — Telephone Encounter (Signed)
Advised patient of results/hd  

## 2022-04-04 ENCOUNTER — Telehealth: Payer: 59 | Admitting: Physician Assistant

## 2022-04-04 DIAGNOSIS — R3989 Other symptoms and signs involving the genitourinary system: Secondary | ICD-10-CM

## 2022-04-04 NOTE — Progress Notes (Signed)
[place]  I provided 5 minutes of non face-to-face time during this encounter for chart review and documentation.

## 2022-05-19 ENCOUNTER — Telehealth: Payer: 59 | Admitting: Nurse Practitioner

## 2022-05-19 DIAGNOSIS — R399 Unspecified symptoms and signs involving the genitourinary system: Secondary | ICD-10-CM | POA: Diagnosis not present

## 2022-05-19 DIAGNOSIS — R102 Pelvic and perineal pain: Secondary | ICD-10-CM

## 2022-05-19 MED ORDER — CEPHALEXIN 500 MG PO CAPS: 500.0000 mg | ORAL_CAPSULE | Freq: Two times a day (BID) | ORAL | 0 refills | Status: DC

## 2022-05-19 NOTE — Progress Notes (Signed)
Based on what you shared with me it looks like you have vaginal pain,that should be evaluated in a face to face office visit. This is not something I can treat in an evisit.  NOTE: There will be NO CHARGE for this eVisit   If you are having a true medical emergency please call 911.      For an urgent face to face visit, [facility] has six urgent care centers for your convenience:     [facility] [place] Get Driving Directions [phone] [address] [place], [place] [address]    [facility]) Get Driving Directions [phone] [place], [place] [address]  [facility] [place] [facility]) Get Driving Directions [phone] [address] [place],  [place]  [address]  [facility] Get Driving Directions [phone] [place], [place], [place] [address]   [facility] Get Driving Directions  [phone] [address] [place], [place] [address]   [facility] [place] Get Driving Directions [phone] [address], [place],  [address]  Your MyChart E-visit questionnaire answers were reviewed by a board certified advanced clinical practitioner to complete your personal care plan based on your specific symptoms.  Thank you for using e-Visits.

## 2022-05-19 NOTE — Progress Notes (Signed)
E-Visit for Urinary Problems  I will treat possible UTI, but I still feel strongly that you need  a face to face visit.  We are sorry that you are not feeling well.  Here is how we plan to help!  Based on what you shared with me it looks like you most likely have a simple urinary tract infection.  A UTI (Urinary Tract Infection) is a bacterial infection of the bladder.  Most cases of urinary tract infections are simple to treat but a key part of your care is to encourage you to drink plenty of fluids and watch your symptoms carefully.  I have prescribed Keflex 500 mg twice a day for 7 days .  Your symptoms should gradually improve. Call us if the burning in your urine worsens, you develop worsening fever, back pain or pelvic pain or if your symptoms do not resolve after completing the antibiotic.  Urinary tract infections can be prevented by drinking plenty of water to keep your body hydrated.  Also be sure when you wipe, wipe from front to back and don't hold it in!  If possible, empty your bladder every 4 hours.  HOME CARE Drink plenty of fluids Compete the full course of the antibiotics even if the symptoms resolve Remember, when you need to go.go. Holding in your urine can increase the likelihood of getting a UTI! GET HELP RIGHT AWAY IF: You cannot urinate You get a high fever Worsening back pain occurs You see blood in your urine You feel sick to your stomach or throw up You feel like you are going to pass out  MAKE SURE YOU  Understand these instructions. Will watch your condition. Will get help right away if you are not doing well or get worse.   Thank you for choosing an e-visit.  Your e-visit answers were reviewed by a board certified advanced clinical practitioner to complete your personal care plan. Depending upon the condition, your plan could have included both over the counter or prescription medications.  Please review your pharmacy choice. Make sure the pharmacy  is open so you can pick up prescription now. If there is a problem, you may contact your provider through [employer] and have the prescription routed to another pharmacy.  Your safety is important to [place]. If you have drug allergies check your prescription carefully.   For the next 24 hours you can use MyChart to ask questions about today's visit, request a non-urgent call back, or ask for a work or school excuse. You will get an email in the next two days asking about your experience. I hope that your e-visit has been valuable and will speed your recovery.  5-10 minutes spent reviewing and documenting in chart.

## 2022-09-14 ENCOUNTER — Other Ambulatory Visit: Payer: Self-pay | Admitting: Obstetrics and Gynecology

## 2022-09-14 DIAGNOSIS — Z1231 Encounter for screening mammogram for malignant neoplasm of breast: Secondary | ICD-10-CM

## 2022-10-10 ENCOUNTER — Ambulatory Visit
Admission: RE | Admit: 2022-10-10 | Discharge: 2022-10-10 | Disposition: A | Discharge status: Home / Self Care | Payer: 59 | Source: Ambulatory Visit | Attending: Obstetrics and Gynecology | Admitting: Obstetrics and Gynecology

## 2022-10-10 ENCOUNTER — Telehealth: Payer: 59 | Admitting: Nurse Practitioner

## 2022-10-10 DIAGNOSIS — Z1231 Encounter for screening mammogram for malignant neoplasm of breast: Secondary | ICD-10-CM | POA: Diagnosis present

## 2022-10-10 DIAGNOSIS — J019 Acute sinusitis, unspecified: Secondary | ICD-10-CM

## 2022-10-10 MED ORDER — AMOXICILLIN-POT CLAVULANATE 875-125 MG PO TABS: 1.0000 | ORAL_TABLET | Freq: Two times a day (BID) | ORAL | 0 refills | Status: AC

## 2022-10-10 NOTE — Progress Notes (Signed)
E-Visit for Sinus Problems  We are sorry that you are not feeling well.  Here is how we plan to help!  Based on what you have shared with me it looks like you have sinusitis.  Sinusitis is inflammation and infection in the sinus cavities of the head.  Based on your presentation I believe you most likely have Acute Bacterial Sinusitis.  This is an infection caused by bacteria and is treated with antibiotics. I have prescribed Augmentin '875mg'$ /'125mg'$  one tablet twice daily with food, for 7 days.   It is important to continue your allergy medications and an over the counter decongestant while on the antibiotics: You may use an oral decongestant such as Mucinex D or if you have glaucoma or high blood pressure use plain Mucinex. Saline nasal spray help and can safely be used as often as needed for congestion.  If you develop worsening sinus pain, fever or notice severe headache and vision changes, or if symptoms are not better after completion of antibiotic, please schedule an appointment with a health care provider.    Sinus infections are not as easily transmitted as other respiratory infection, however we still recommend that you avoid close contact with loved ones, especially the very young and elderly.  Remember to wash your hands thoroughly throughout the day as this is the number one way to prevent the spread of infection!  Home Care: Only take medications as instructed by your medical team. Complete the entire course of an antibiotic. Do not take these medications with alcohol. A steam or ultrasonic humidifier can help congestion.  You can place a towel over your head and breathe in the steam from hot water coming from a faucet. Avoid close contacts especially the very young and the elderly. Cover your mouth when you cough or sneeze. Always remember to wash your hands.  Get Help Right Away If: You develop worsening fever or sinus pain. You develop a severe head ache or visual changes. Your  symptoms persist after you have completed your treatment plan.  Make sure you Understand these instructions. Will watch your condition. Will get help right away if you are not doing well or get worse.  Thank you for choosing an e-visit.  Your e-visit answers were reviewed by a board certified advanced clinical practitioner to complete your personal care plan. Depending upon the condition, your plan could have included both over the counter or prescription medications.  Please review your pharmacy choice. Make sure the pharmacy is open so you can pick up prescription now. If there is a problem, you may contact your provider through [employer] and have the prescription routed to another pharmacy.  Your safety is important to [place]. If you have drug allergies check your prescription carefully.   For the next 24 hours you can use MyChart to ask questions about today's visit, request a non-urgent call back, or ask for a work or school excuse. You will get an email in the next two days asking about your experience. I hope that your e-visit has been valuable and will speed your recovery.   Meds ordered this encounter  Medications   amoxicillin-clavulanate (AUGMENTIN) 875-125 MG tablet    Sig: Take 1 tablet by mouth 2 (two) times daily for 7 days. Take with food    Dispense:  14 tablet    Refill:  0     I spent approximately 5 minutes reviewing the patient's history, current symptoms and coordinating their care today.

## 2022-10-20 ENCOUNTER — Telehealth: Payer: 59 | Admitting: Nurse Practitioner

## 2022-10-20 DIAGNOSIS — B3731 Acute candidiasis of vulva and vagina: Secondary | ICD-10-CM | POA: Diagnosis not present

## 2022-10-20 MED ORDER — FLUCONAZOLE 150 MG PO TABS: 150.0000 mg | ORAL_TABLET | Freq: Once | ORAL | 0 refills | Status: AC

## 2022-10-20 NOTE — Progress Notes (Signed)

## 2022-12-25 ENCOUNTER — Other Ambulatory Visit: Payer: Self-pay | Admitting: Nurse Practitioner

## 2023-02-28 ENCOUNTER — Ambulatory Visit: Payer: 59 | Admitting: Dermatology

## 2023-03-12 ENCOUNTER — Telehealth: Payer: 59 | Admitting: Nurse Practitioner

## 2023-03-12 DIAGNOSIS — R300 Dysuria: Secondary | ICD-10-CM

## 2023-03-12 MED ORDER — CEPHALEXIN 500 MG PO CAPS: 500.0000 mg | ORAL_CAPSULE | Freq: Two times a day (BID) | ORAL | 0 refills | Status: AC

## 2023-03-12 NOTE — Progress Notes (Signed)

## 2023-07-17 ENCOUNTER — Ambulatory Visit: Payer: 59 | Admitting: Dermatology

## 2023-07-17 DIAGNOSIS — L719 Rosacea, unspecified: Secondary | ICD-10-CM

## 2023-07-17 DIAGNOSIS — D239 Other benign neoplasm of skin, unspecified: Secondary | ICD-10-CM

## 2023-07-17 DIAGNOSIS — D229 Melanocytic nevi, unspecified: Secondary | ICD-10-CM

## 2023-07-17 DIAGNOSIS — L718 Other rosacea: Secondary | ICD-10-CM | POA: Diagnosis not present

## 2023-07-17 DIAGNOSIS — Z1283 Encounter for screening for malignant neoplasm of skin: Secondary | ICD-10-CM | POA: Diagnosis not present

## 2023-07-17 DIAGNOSIS — L821 Other seborrheic keratosis: Secondary | ICD-10-CM

## 2023-07-17 DIAGNOSIS — L814 Other melanin hyperpigmentation: Secondary | ICD-10-CM

## 2023-07-17 DIAGNOSIS — D2362 Other benign neoplasm of skin of left upper limb, including shoulder: Secondary | ICD-10-CM

## 2023-07-17 DIAGNOSIS — L578 Other skin changes due to chronic exposure to nonionizing radiation: Secondary | ICD-10-CM

## 2023-07-17 DIAGNOSIS — Z79899 Other long term (current) drug therapy: Secondary | ICD-10-CM

## 2023-07-17 DIAGNOSIS — W908XXA Exposure to other nonionizing radiation, initial encounter: Secondary | ICD-10-CM

## 2023-07-17 DIAGNOSIS — D2372 Other benign neoplasm of skin of left lower limb, including hip: Secondary | ICD-10-CM

## 2023-07-17 DIAGNOSIS — Z7189 Other specified counseling: Secondary | ICD-10-CM

## 2023-07-17 DIAGNOSIS — D2371 Other benign neoplasm of skin of right lower limb, including hip: Secondary | ICD-10-CM

## 2023-07-17 DIAGNOSIS — D225 Melanocytic nevi of trunk: Secondary | ICD-10-CM

## 2023-07-17 MED ORDER — DOXYCYCLINE 40 MG PO CPDR: DELAYED_RELEASE_CAPSULE | ORAL | 11 refills | Status: DC

## 2023-07-17 NOTE — Patient Instructions (Addendum)
Start Doxycycline 40 mg  Take 1 capsule by mouth daily with evening meal.  Doxycycline should be taken with food to prevent nausea. Do not lay down for 30 minutes after taking. Be cautious with sun exposure and use good sun protection while on this medication. Pregnant women should not take this medication.   Your prescription was sent to [facility] in [place]. A representative from [facility] will contact you within 3 business hours to verify your address and insurance information to schedule a free delivery. If for any reason you do not receive a phone call from them, please reach out to them. Their phone number is [phone] and their hours are Monday-Friday 9:00 am-5:00 pm.      Rosacea  What is rosacea? Rosacea (say: ro-zay-sha) is a common skin disease that usually begins as a trend of flushing or blushing easily.  As rosacea progresses, a persistent redness in the center of the face will develop and may gradually spread beyond the nose and cheeks to the forehead and chin.  In some cases, the ears, chest, and back could be affected.  Rosacea may appear as tiny blood vessels or small red bumps that occur in crops.  Frequently they can contain pus, and are called "pustules".  If the bumps do not contain pus, they are referred to as "papules".  Rarely, in prolonged, untreated cases of rosacea, the oil glands of the nose and cheeks may become permanently enlarged.  This is called rhinophyma, and is seen more frequently in men.  Signs and Risks In its beginning stages, rosacea tends to come and go, which makes it difficult to recognize.  It can start as intermittent flushing of the face.  Eventually, blood vessels may become permanently visible.  Pustules and papules can appear, but can be mistaken for adult acne.  People of all races, ages, genders and ethnic groups are at risk of developing rosacea.  However, it is more common in women (especially around menopause) and adults with  fair skin between the ages of 35 and 13.  Treatment Dermatologists typically recommend a combination of treatments to effectively manage rosacea.  Treatment can improve symptoms and may stop the progression of the rosacea.  Treatment may involve both topical and oral medications.  The tetracycline antibiotics are often used for their anti-inflammatory effect; however, because of the possibility of developing antibiotic resistance, they should not be used long term at full dose.  For dilated blood vessels the options include electrodessication (uses electric current through a small needle), laser treatment, and cosmetics to hide the redness.   With all forms of treatment, improvement is a slow process, and patients may not see any results for the first 3-4 weeks.  It is very important to avoid the sun and other triggers.  Patients must wear sunscreen daily.  Skin Care Instructions: Cleanse the skin with a mild soap such as CeraVe cleanser, Cetaphil cleanser, or Dove soap once or twice daily as needed. Moisturize with Eucerin Redness Relief Daily Perfecting Lotion (has a subtle green tint), CeraVe Moisturizing Cream, or Oil of Olay Daily Moisturizer with sunscreen every morning and/or night as recommended. Makeup should be "non-comedogenic" (won't clog pores) and be labeled "for sensitive skin". Good choices for cosmetics are: Neutrogena, Almay, and Physician's Formula.  Any product with a green tint tends to offset a red complexion. If your eyes are dry and irritated, use artificial tears 2-3 times per day and cleanse the eyelids daily with baby shampoo.  Have  your eyes examined at least every 2 years.  Be sure to tell your eye doctor that you have rosacea. Alcoholic beverages tend to cause flushing of the skin, and may make rosacea worse. Always wear sunscreen, protect your skin from extreme hot and cold temperatures, and avoid spicy foods, hot drinks, and mechanical irritation such as rubbing,  scrubbing, or massaging the face.  Avoid harsh skin cleansers, cleansing masks, astringents, and exfoliation. If a particular product burns or makes your face feel tight, then it is likely to flare your rosacea. If you are having difficulty finding a sunscreen that you can tolerate, you may try switching to a chemical-free sunscreen.  These are ones whose active ingredient is zinc oxide or titanium dioxide only.  They should also be fragrance free, non-comedogenic, and labeled for sensitive skin. Rosacea triggers may vary from person to person.  There are a variety of foods that have been reported to trigger rosacea.  Some patients find that keeping a diary of what they were doing when they flared helps them avoid triggers.    Melanoma ABCDEs  Melanoma is the most dangerous type of skin cancer, and is the leading cause of death from skin disease.  You are more likely to develop melanoma if you: Have light-colored skin, light-colored eyes, or red or blond hair Spend a lot of time in the sun Tan regularly, either outdoors or in a tanning bed Have had blistering sunburns, especially during childhood Have a close family member who has had a melanoma Have atypical moles or large birthmarks  Early detection of melanoma is key since treatment is typically straightforward and cure rates are extremely high if we catch it early.   The first sign of melanoma is often a change in a mole or a new dark spot.  The ABCDE system is a way of remembering the signs of melanoma.  A for asymmetry:  The two halves do not match. B for border:  The edges of the growth are irregular. C for color:  A mixture of colors are present instead of an even brown color. D for diameter:  Melanomas are usually (but not always) greater than 6mm - the size of a pencil eraser. E for evolution:  The spot keeps changing in size, shape, and color.  Please check your skin once per month between visits. You can use a small mirror in  front and a large mirror behind you to keep an eye on the back side or your body.   If you see any new or changing lesions before your next follow-up, please call to schedule a visit.  Please continue daily skin protection including broad spectrum sunscreen SPF 30+ to sun-exposed areas, reapplying every 2 hours as needed when you're outdoors.   Staying in the shade or wearing long sleeves, sun glasses (UVA+UVB protection) and wide brim hats (4-inch brim around the entire circumference of the hat) are also recommended for sun protection.    Due to recent changes in healthcare laws, you may see results of your pathology and/or laboratory studies on MyChart before the doctors have had a chance to review them. We understand that in some cases there may be results that are confusing or concerning to you. Please understand that not all results are received at the same time and often the doctors may need to interpret multiple results in order to provide you with the best plan of care or course of treatment. Therefore, we ask that you please give [place]  2 business days to thoroughly review all your results before contacting the office for clarification. Should we see a critical lab result, you will be contacted sooner.   If You Need Anything After Your Visit  If you have any questions or concerns for your doctor, please call our main line at [phone] and press option 4 to reach your doctor's medical assistant. If no one answers, please leave a voicemail as directed and we will return your call as soon as possible. Messages left after 4 pm will be answered the following business day.   You may also send [place] a message via MyChart. We typically respond to MyChart messages within 1-2 business days.  For prescription refills, please ask your pharmacy to contact our office. Our fax number is [phone].  If you have an urgent issue when the clinic is closed that cannot wait until the next business day, you can  page your doctor at the number below.    Please note that while we do our best to be available for urgent issues outside of office hours, we are not available 24/7.   If you have an urgent issue and are unable to reach [place], you may choose to seek medical care at your doctor's office, retail clinic, urgent care center, or emergency room.  If you have a medical emergency, please immediately call 911 or go to the emergency department.  Pager Numbers  - Dr. Gwen Pounds: [phone]  - Dr. Roseanne Reno: [phone]  - Dr. Katrinka Blazing: [phone]   In the event of inclement weather, please call our main line at [phone] for an update on the status of any delays or closures.  Dermatology Medication Tips: Please keep the boxes that topical medications come in in order to help keep track of the instructions about where and how to use these. Pharmacies typically print the medication instructions only on the boxes and not directly on the medication tubes.   If your medication is too expensive, please contact our office at [phone] option 4 or send [place] a message through MyChart.   We are unable to tell what your co-pay for medications will be in advance as this is different depending on your insurance coverage. However, we may be able to find a substitute medication at lower cost or fill out paperwork to get insurance to cover a needed medication.   If a prior authorization is required to get your medication covered by your insurance company, please allow [place] 1-2 business days to complete this process.  Drug prices often vary depending on where the prescription is filled and some pharmacies may offer cheaper prices.  The website www.goodrx.com contains coupons for medications through different pharmacies. The prices here do not account for what the cost may be with help from insurance (it may be cheaper with your insurance), but the website can give you the price if you did not use any insurance.  - You  can print the associated coupon and take it with your prescription to the pharmacy.  - You may also stop by our office during regular business hours and pick up a GoodRx coupon card.  - If you need your prescription sent electronically to a different pharmacy, notify our office through [facility] or by phone at [phone] option 4.     Si Usted Necesita Algo Despus de Su Visita  Tambin puede enviarnos un mensaje a travs de Clinical cytogeneticist. Por lo general respondemos a los mensajes de MyChart en el transcurso de 1 a  2 das hbiles.  Para renovar recetas, por favor pida a su farmacia que se ponga en contacto con nuestra oficina. Annie Sable de fax es [place] [phone].  Si tiene un asunto urgente cuando la clnica est cerrada y que no puede esperar hasta el siguiente da hbil, puede llamar/localizar a su doctor(a) al nmero que aparece a continuacin.   Por favor, tenga en cuenta que aunque hacemos todo lo posible para estar disponibles para asuntos urgentes fuera del horario de [place], no estamos disponibles las 24 horas del da, los 7 [address]  [address] de la [place].   Si tiene un problema urgente y no puede comunicarse con nosotros, puede optar por buscar atencin mdica  en el consultorio de su doctor(a), en una clnica privada, en un centro de atencin urgente o en una sala de emergencias.  Si tiene Engineer, drilling, por favor llame inmediatamente al 911 o vaya a la sala de emergencias.  Nmeros de bper  - Dr. Gwen Pounds: [phone]  - Dra. Roseanne Reno: [phone]  - Dr. Katrinka Blazing: [phone]   En caso de inclemencias del tiempo, por favor llame a Lacy Duverney principal al [phone] para una actualizacin sobre el [place] de cualquier retraso o cierre.  Consejos para la medicacin en dermatologa: Por favor, guarde las cajas en las que vienen los medicamentos de uso tpico para ayudarle a seguir las instrucciones sobre dnde y cmo usarlos. Las farmacias generalmente imprimen las  instrucciones del medicamento slo en las cajas y no directamente en los tubos del [place].   Si su medicamento es muy caro, por favor, pngase en contacto con Rolm Gala llamando al [phone] y presione la opcin 4 o envenos un mensaje a travs de Clinical cytogeneticist.   No podemos decirle cul ser su copago por los medicamentos por adelantado ya que esto es diferente dependiendo de la cobertura de su seguro. Sin embargo, es posible que podamos encontrar un medicamento sustituto a Audiological scientist un formulario para que el seguro cubra el medicamento que se considera necesario.   Si se requiere una autorizacin previa para que su compaa de seguros [place] su medicamento, por favor permtanos de 1 a 2 das hbiles para completar [address]  Los precios de los medicamentos varan con frecuencia dependiendo del Environmental consultant de dnde se surte la receta y alguna farmacias pueden ofrecer precios ms baratos.  El sitio web www.goodrx.com tiene cupones para medicamentos de Health and safety inspector. Los precios aqu no tienen en cuenta lo que podra costar con la ayuda del seguro (puede ser ms barato con su seguro), pero el sitio web puede darle el precio si no utiliz Tourist information centre manager.  - Puede imprimir el cupn correspondiente y llevarlo con su receta a la farmacia.  - Tambin puede pasar por nuestra oficina durante el horario de atencin regular y Education officer, museum una tarjeta de cupones de GoodRx.  - Si necesita que su receta se enve electrnicamente a una farmacia diferente, informe a nuestra oficina a travs de MyChart de [place] o por telfono llamando al [phone] y presione la opcin 4.

## 2023-07-17 NOTE — Progress Notes (Signed)
Follow-Up Visit   Subjective  Jenna White is a 47 y.o. female who presents for the following: Skin Cancer Screening and Full Body Skin Exam The patient presents for Total-Body Skin Exam (TBSE) for skin cancer screening and mole check. The patient has spots, moles and lesions to be evaluated, some may be new or changing and the patient may have concern these could be cancer.   The following portions of the chart were reviewed this encounter and updated as appropriate: medications, allergies, medical history  Review of Systems:  No other skin or systemic complaints except as noted in HPI or Assessment and Plan.  Objective  Well appearing patient in no apparent distress; mood and affect are within normal limits.  A full examination was performed including scalp, head, eyes, ears, nose, lips, neck, chest, axillae, abdomen, back, buttocks, bilateral upper extremities, bilateral lower extremities, hands, feet, fingers, toes, fingernails, and toenails. All findings within normal limits unless otherwise noted below.   Relevant physical exam findings are noted in the Assessment and Plan.               Assessment & Plan   SKIN CANCER SCREENING PERFORMED TODAY.  ROSACEA with ocular rosacea Exam Mid face erythema with telangiectasias and erythema and conjunctival erythema  Chronic and persistent condition with duration or expected duration over one year. Condition is bothersome/symptomatic for patient. Currently flared.   Rosacea is a chronic progressive skin condition usually affecting the face of adults, causing redness and/or acne bumps. It is treatable but not curable. It sometimes affects the eyes (ocular rosacea) as well. It may respond to topical and/or systemic medication and can flare with stress, sun exposure, alcohol, exercise, topical steroids (including hydrocortisone/cortisone 10) and some foods.  Daily application of broad spectrum spf 30+ sunscreen to face is  recommended to reduce flares.  Patient with grittiness of the eyes  Treatment Plan Discussed eye drops Discussed doxycycline 20 - 40 mg po qd with food   Doxycycline should be taken with food to prevent nausea. Do not lay down for 30 minutes after taking. Be cautious with sun exposure and use good sun protection while on this medication. Pregnant women should not take this medication.    ACTINIC DAMAGE - Chronic condition, secondary to cumulative UV/sun exposure - diffuse scaly erythematous macules with underlying dyspigmentation - Recommend daily broad spectrum sunscreen SPF 30+ to sun-exposed areas, reapply every 2 hours as needed.  - Staying in the shade or wearing long sleeves, sun glasses (UVA+UVB protection) and wide brim hats (4-inch brim around the entire circumference of the hat) are also recommended for sun protection.  - Call for new or changing lesions.  LENTIGINES, SEBORRHEIC KERATOSES, HEMANGIOMAS - Benign normal skin lesions - Benign-appearing - Call for any changes  DERMATOFIBROMA At right thigh above knee at left upper arm and left leg  Exam: Firm pink/brown papulenodule with dimple sign. Treatment Plan: A dermatofibroma is a benign growth possibly related to trauma, such as an insect bite, cut from shaving, or inflamed acne-type bump.  Treatment options to remove include shave or excision with resulting scar and risk of recurrence.  Since benign-appearing and not bothersome, will observe for now.   Discussed only way to fully remove is with excision.   MELANOCYTIC NEVI - Tan-brown and/or pink-flesh-colored symmetric macules and papules - Benign appearing on exam today - Observation - Call clinic for new or changing moles - Recommend daily use of broad spectrum spf 30+ sunscreen to sun-exposed  areas.   Nevus (5) right upper quadrant abdomen; left lateral abdomen above waist; right lower quadrant abdomen; left posterior flank; left posterior flank  medial   Left posterior flank 1.4 x 0.6 cm regular brown macule    left posterior flank medial 1.2 x 0.6 cm regular brown macule    right upper quadrant abdomen 1 cm regular brown macule    right lower quadrant abdomen 1.5 x 1.2 cm regular brown macule    left lateral abdomen above waist 1.2 cm regular brown macule   See previous photos Benign-appearing.  Observation.  Call clinic for new or changing lesions.  Recommend daily use of broad spectrum spf 30+ sunscreen to sun-exposed areas.    Rosacea  Related Medications doxycycline (ORACEA) 40 MG capsule Take 40 mg capsule po with evening meal daily   Return in about 1 year (around 07/16/2024) for TBSE.  IAsher Muir, CMA, am acting as scribe for Armida Sans, MD.  Documentation: I have reviewed the above documentation for accuracy and completeness, and I agree with the above.  Armida Sans, MD

## 2023-07-30 ENCOUNTER — Encounter: Payer: Self-pay | Admitting: Dermatology

## 2023-08-27 ENCOUNTER — Other Ambulatory Visit: Payer: Self-pay

## 2023-08-27 ENCOUNTER — Other Ambulatory Visit: Payer: Self-pay | Admitting: Obstetrics and Gynecology

## 2023-08-27 DIAGNOSIS — Z1231 Encounter for screening mammogram for malignant neoplasm of breast: Secondary | ICD-10-CM

## 2023-08-27 DIAGNOSIS — L719 Rosacea, unspecified: Secondary | ICD-10-CM

## 2023-08-27 MED ORDER — DOXYCYCLINE 40 MG PO CPDR: DELAYED_RELEASE_CAPSULE | ORAL | 10 refills | Status: DC

## 2023-08-27 NOTE — Progress Notes (Signed)
Change of pharmacy per patient request. aw

## 2023-10-14 ENCOUNTER — Ambulatory Visit
Admission: RE | Admit: 2023-10-14 | Discharge: 2023-10-14 | Disposition: A | Discharge status: Home / Self Care | Payer: BC Managed Care – PPO | Source: Ambulatory Visit | Attending: Obstetrics and Gynecology | Admitting: Obstetrics and Gynecology

## 2023-10-14 DIAGNOSIS — Z1231 Encounter for screening mammogram for malignant neoplasm of breast: Secondary | ICD-10-CM | POA: Insufficient documentation

## 2023-10-18 ENCOUNTER — Other Ambulatory Visit: Payer: Self-pay | Admitting: Obstetrics and Gynecology

## 2023-10-18 DIAGNOSIS — N6489 Other specified disorders of breast: Secondary | ICD-10-CM

## 2023-10-18 DIAGNOSIS — R928 Other abnormal and inconclusive findings on diagnostic imaging of breast: Secondary | ICD-10-CM

## 2023-10-23 ENCOUNTER — Ambulatory Visit
Admission: RE | Admit: 2023-10-23 | Discharge: 2023-10-23 | Disposition: A | Discharge status: Home / Self Care | Payer: BC Managed Care – PPO | Source: Ambulatory Visit | Attending: Obstetrics and Gynecology | Admitting: Obstetrics and Gynecology

## 2023-10-23 DIAGNOSIS — R928 Other abnormal and inconclusive findings on diagnostic imaging of breast: Secondary | ICD-10-CM

## 2023-10-23 DIAGNOSIS — N6489 Other specified disorders of breast: Secondary | ICD-10-CM | POA: Insufficient documentation

## 2023-10-25 ENCOUNTER — Other Ambulatory Visit: Payer: Self-pay | Admitting: Obstetrics and Gynecology

## 2023-10-25 DIAGNOSIS — R928 Other abnormal and inconclusive findings on diagnostic imaging of breast: Secondary | ICD-10-CM

## 2023-10-25 DIAGNOSIS — N6489 Other specified disorders of breast: Secondary | ICD-10-CM

## 2023-10-31 ENCOUNTER — Ambulatory Visit
Admission: RE | Admit: 2023-10-31 | Discharge: 2023-10-31 | Disposition: A | Discharge status: Home / Self Care | Payer: BC Managed Care – PPO | Source: Ambulatory Visit | Attending: Obstetrics and Gynecology | Admitting: Obstetrics and Gynecology

## 2023-10-31 ENCOUNTER — Other Ambulatory Visit: Payer: Self-pay

## 2023-10-31 DIAGNOSIS — N6022 Fibroadenosis of left breast: Secondary | ICD-10-CM | POA: Diagnosis present

## 2023-10-31 DIAGNOSIS — R928 Other abnormal and inconclusive findings on diagnostic imaging of breast: Secondary | ICD-10-CM | POA: Insufficient documentation

## 2023-10-31 DIAGNOSIS — N6489 Other specified disorders of breast: Secondary | ICD-10-CM | POA: Insufficient documentation

## 2023-10-31 HISTORY — PX: BREAST BIOPSY: SHX20

## 2023-10-31 MED ORDER — LIDOCAINE-EPINEPHRINE 1 %-1:100000 IJ SOLN
20.0000 mL | Freq: Once | INTRAMUSCULAR | Status: AC
  Administered 2023-10-31: 20 mL
  Filled 2023-10-31: qty 20

## 2023-10-31 MED ORDER — LIDOCAINE 1 % OPTIME INJ - NO CHARGE
5.0000 mL | Freq: Once | INTRAMUSCULAR | Status: AC
  Administered 2023-10-31: 5 mL
  Filled 2023-10-31: qty 6

## 2023-11-01 LAB — SURGICAL PATHOLOGY

## 2024-02-11 ENCOUNTER — Ambulatory Visit: Payer: 59 | Admitting: Dermatology

## 2024-02-11 DIAGNOSIS — Z7189 Other specified counseling: Secondary | ICD-10-CM

## 2024-02-11 DIAGNOSIS — Z79899 Other long term (current) drug therapy: Secondary | ICD-10-CM

## 2024-02-11 DIAGNOSIS — L718 Other rosacea: Secondary | ICD-10-CM | POA: Diagnosis not present

## 2024-02-11 DIAGNOSIS — L719 Rosacea, unspecified: Secondary | ICD-10-CM

## 2024-02-11 MED ORDER — DOXYCYCLINE HYCLATE 20 MG PO TABS: ORAL_TABLET | ORAL | 1 refills | Status: DC

## 2024-02-11 MED ORDER — DOXYCYCLINE HYCLATE 20 MG PO TABS: 20.0000 mg | ORAL_TABLET | Freq: Two times a day (BID) | ORAL | 1 refills | Status: DC

## 2024-02-11 NOTE — Patient Instructions (Signed)

## 2024-02-11 NOTE — Progress Notes (Deleted)
   Follow-Up Visit   Subjective  Jenna White is a 48 y.o. female who presents for the following: Rosacea    The following portions of the chart were reviewed this encounter and updated as appropriate: medications, allergies, medical history  Review of Systems:  No other skin or systemic complaints except as noted in HPI or Assessment and Plan.  Objective  Well appearing patient in no apparent distress; mood and affect are within normal limits.  Areas Examined: face  Relevant physical exam findings are noted in the Assessment and Plan.    Assessment & Plan     ROSACEA  Exam:  Mid face erythema with telangiectasias +/- scattered inflammatory papules.  ***wellcontrolled vs notatgoal vs flared  Rosacea is a chronic progressive skin condition usually affecting the face of adults, causing redness and/or acne bumps. It is treatable but not curable. It sometimes affects the eyes (ocular rosacea) as well. It may respond to topical and/or systemic medication and can flare with stress, sun exposure, alcohol, exercise, topical steroids (including hydrocortisone/cortisone 10) and some foods.  Daily application of broad spectrum spf 30+ sunscreen to face is recommended to reduce flares.  Treatment Plan *** Long term medication management.  Patient is using long term (months to years) prescription medication  to control their dermatologic condition.  These medications require periodic monitoring to evaluate for efficacy and side effects.   No follow-ups on file.  IClara Crisp, CMA, am acting as scribe for Celine Collard, MD .   Documentation: I have reviewed the above documentation for accuracy and completeness, and I agree with the above.  Celine Collard, MD

## 2024-02-11 NOTE — Progress Notes (Unsigned)
   Follow-Up Visit   Subjective  Jenna White is a 48 y.o. female who presents for the following: 6 months f/u on Rosacea- treating with Doxycycline  40 mg once a day with a good response.  The following portions of the chart were reviewed this encounter and updated as appropriate: medications, allergies, medical history  Review of Systems:  No other skin or systemic complaints except as noted in HPI or Assessment and Plan.  Objective  Well appearing patient in no apparent distress; mood and affect are within normal limits.  Areas Examined: face  Relevant physical exam findings are noted in the Assessment and Plan.   Assessment & Plan   ROSACEA with ocular rosacea Exam No significant erythema of conjunctiva today. Mild pinkness of mid face today. Chronic and persistent condition with duration or expected duration over one year. Condition is improving with treatment but not currently at goal. Rosacea is a chronic progressive skin condition usually affecting the face of adults, causing redness and/or acne bumps. It is treatable but not curable. It sometimes affects the eyes (ocular rosacea) as well. It may respond to topical and/or systemic medication and can flare with stress, sun exposure, alcohol, exercise, topical steroids (including hydrocortisone/cortisone 10) and some foods.  Daily application of broad spectrum spf 30+ sunscreen to face is recommended to reduce flares.  Treatment Plan Continue Doxycycline  20 mg twice a day with food   Long term medication management.  Patient is using long term (months to years) prescription medication  to control their dermatologic condition.  These medications require periodic monitoring to evaluate for efficacy and side effects.   Return for scheduled appt in October .  IClara Crisp, CMA, am acting as scribe for Celine Collard, MD .   Documentation: I have reviewed the above documentation for accuracy and completeness, and I agree  with the above.  Celine Collard, MD

## 2024-02-12 ENCOUNTER — Encounter: Payer: Self-pay | Admitting: Dermatology

## 2024-07-16 ENCOUNTER — Ambulatory Visit: Payer: 59 | Admitting: Dermatology

## 2024-07-16 ENCOUNTER — Encounter: Payer: Self-pay | Admitting: Dermatology

## 2024-07-16 DIAGNOSIS — D225 Melanocytic nevi of trunk: Secondary | ICD-10-CM

## 2024-07-16 DIAGNOSIS — D2371 Other benign neoplasm of skin of right lower limb, including hip: Secondary | ICD-10-CM

## 2024-07-16 DIAGNOSIS — Z7189 Other specified counseling: Secondary | ICD-10-CM

## 2024-07-16 DIAGNOSIS — D239 Other benign neoplasm of skin, unspecified: Secondary | ICD-10-CM

## 2024-07-16 DIAGNOSIS — L578 Other skin changes due to chronic exposure to nonionizing radiation: Secondary | ICD-10-CM

## 2024-07-16 DIAGNOSIS — Z1283 Encounter for screening for malignant neoplasm of skin: Secondary | ICD-10-CM | POA: Diagnosis not present

## 2024-07-16 DIAGNOSIS — Z79899 Other long term (current) drug therapy: Secondary | ICD-10-CM

## 2024-07-16 DIAGNOSIS — L719 Rosacea, unspecified: Secondary | ICD-10-CM

## 2024-07-16 DIAGNOSIS — D492 Neoplasm of unspecified behavior of bone, soft tissue, and skin: Secondary | ICD-10-CM | POA: Diagnosis not present

## 2024-07-16 DIAGNOSIS — W908XXA Exposure to other nonionizing radiation, initial encounter: Secondary | ICD-10-CM

## 2024-07-16 DIAGNOSIS — L718 Other rosacea: Secondary | ICD-10-CM

## 2024-07-16 DIAGNOSIS — D229 Melanocytic nevi, unspecified: Secondary | ICD-10-CM

## 2024-07-16 DIAGNOSIS — L814 Other melanin hyperpigmentation: Secondary | ICD-10-CM | POA: Diagnosis not present

## 2024-07-16 DIAGNOSIS — D2362 Other benign neoplasm of skin of left upper limb, including shoulder: Secondary | ICD-10-CM

## 2024-07-16 DIAGNOSIS — D2372 Other benign neoplasm of skin of left lower limb, including hip: Secondary | ICD-10-CM

## 2024-07-16 HISTORY — DX: Other benign neoplasm of skin, unspecified: D23.9

## 2024-07-16 MED ORDER — METRONIDAZOLE 1 % EX GEL: Freq: Every day | CUTANEOUS | 5 refills | Status: AC

## 2024-07-16 NOTE — Progress Notes (Signed)
 Follow-Up Visit   Subjective  Jenna White is a 48 y.o. female who presents for the following: Skin Cancer Screening and Full Body Skin Exam. Hx of rosacea taking doxycycline  20 mg BID with no side effects.   The patient presents for Total-Body Skin Exam (TBSE) for skin cancer screening and mole check. The patient has spots, moles and lesions to be evaluated, some may be new or changing and the patient may have concern these could be cancer.  The following portions of the chart were reviewed this encounter and updated as appropriate: medications, allergies, medical history  Review of Systems:  No other skin or systemic complaints except as noted in HPI or Assessment and Plan.  Objective  Well appearing patient in no apparent distress; mood and affect are within normal limits.  A full examination was performed including scalp, head, eyes, ears, nose, lips, neck, chest, axillae, abdomen, back, buttocks, bilateral upper extremities, bilateral lower extremities, hands, feet, fingers, toes, fingernails, and toenails. All findings within normal limits unless otherwise noted below.   Relevant physical exam findings are noted in the Assessment and Plan.  Left plantar foot   Left Buttock 0.7 Irregular brown papule  left posterior flank Left posterior flank 1.4 x 0.6 cm regular brown macule  left lateral abdomen above waist 1.2 cm regular brown macule  right lower quadrant abdomen 1.5 x 1.2 cm regular brown macule  right upper quadrant abdomen 1 cm regular brown macule  left posterior flank medial 1.2 x 0.6 cm regular brown macule   Assessment & Plan   SKIN CANCER SCREENING PERFORMED TODAY.  ACTINIC DAMAGE - Chronic condition, secondary to cumulative UV/sun exposure - diffuse scaly erythematous macules with underlying dyspigmentation - Recommend daily broad spectrum sunscreen SPF 30+ to sun-exposed areas, reapply every 2 hours as needed.  - Staying in the shade or wearing  long sleeves, sun glasses (UVA+UVB protection) and wide brim hats (4-inch brim around the entire circumference of the hat) are also recommended for sun protection.  - Call for new or changing lesions.  LENTIGINES, SEBORRHEIC KERATOSES, HEMANGIOMAS - Benign normal skin lesions - Benign-appearing - Call for any changes  MELANOCYTIC NEVI - Tan-brown and/or pink-flesh-colored symmetric macules and papules - Benign appearing on exam today - Observation - Call clinic for new or changing moles - Recommend daily use of broad spectrum spf 30+ sunscreen to sun-exposed areas.   DERMATOFIBROMA At right thigh above knee at left upper arm and left leg  Exam: Firm pink/brown papulenodule with dimple sign. Treatment Plan: A dermatofibroma is a benign growth possibly related to trauma, such as an insect bite, cut from shaving, or inflamed acne-type bump.  Treatment options to remove include shave or excision with resulting scar and risk of recurrence.  Since benign-appearing and not bothersome, will observe for now.   ROSACEA with ocular rosacea  Exam Minimal pinkness at medial cheeks. One active papule at face. Eyes and face improved.  Chronic and persistent condition with duration or expected duration over one year. Condition is symptomatic/ bothersome to patient. Not currently at goal. Rosacea is a chronic progressive skin condition usually affecting the face of adults, causing redness and/or acne bumps. It is treatable but not curable. It sometimes affects the eyes (ocular rosacea) as well. It may respond to topical and/or systemic medication and can flare with stress, sun exposure, alcohol, exercise, topical steroids (including hydrocortisone/cortisone 10) and some foods.  Daily application of broad spectrum spf 30+ sunscreen to face is recommended  to reduce flares. Treatment Plan Continue Doxycycline  20 mg twice a day with food   Doxycycline  should be taken with food to prevent nausea. Do not lay  down for 30 minutes after taking. Be cautious with sun exposure and use good sun protection while on this medication. Pregnant women should not take this medication.  Start SM triple cream, apply nightly to aa, cheeks, chin, and nose.   NEOPLASM OF SKIN Left Buttock Epidermal / dermal shaving  Lesion diameter (cm):  0.7 Informed consent: discussed and consent obtained   Timeout: patient name, date of birth, surgical site, and procedure verified   Procedure prep:  Patient was prepped and draped in usual sterile fashion Prep type:  Isopropyl alcohol Anesthesia: the lesion was anesthetized in a standard fashion   Anesthetic:  1% lidocaine  w/ epinephrine  1-100,000 buffered w/ 8.4% NaHCO3 Instrument used: flexible razor blade   Hemostasis achieved with: pressure, aluminum chloride and electrodesiccation   Outcome: patient tolerated procedure well   Post-procedure details: sterile dressing applied and wound care instructions given   Dressing type: bandage and petrolatum    Related Procedures Anatomic Pathology Report NEVUS (5) left lateral abdomen above waist, left posterior flank, left posterior flank medial, right lower quadrant abdomen, right upper quadrant abdomen See previous photos  Benign-appearing.  Observation.  Call clinic for new or changing lesions.  Recommend daily use of broad spectrum spf 30+ sunscreen to sun-exposed areas.    Return in about 1 year (around 07/16/2025) for TBSE, w/ Dr. Hester.  I, Jacquelynn V. Wilfred, CMA, am acting as scribe for Alm Hester, MD.   Documentation: I have reviewed the above documentation for accuracy and completeness, and I agree with the above.  Alm Hester, MD

## 2024-07-16 NOTE — Patient Instructions (Addendum)
Instructions for [facility] Medications  One or more of your medications was sent to the [facility] mail order compounding pharmacy. You will receive an email from them and can purchase the medicine through that link. It will then be mailed to your home at the address you confirmed. If for any reason you do not receive an email from them, please check your spam folder. If you still do not find the email, please let us know. [facility] phone number is [phone].  Wound Care Instructions  Cleanse wound gently with soap and water once a day then pat dry with clean gauze. Apply a thin coat of Petrolatum (petroleum jelly, "Vaseline") over the wound (unless you have an allergy to this). We recommend that you use a new, sterile tube of Vaseline. Do not pick or remove scabs. Do not remove the yellow or white "healing tissue" from the base of the wound.  Cover the wound with fresh, clean, nonstick gauze and secure with paper tape. You may use Band-Aids in place of gauze and tape if the wound is small enough, but would recommend trimming much of the tape off as there is often too much. Sometimes Band-Aids can irritate the skin.  You should call the office for your biopsy report after 1 week if you have not already been contacted.  If you experience any problems, such as abnormal amounts of bleeding, swelling, significant bruising, significant pain, or evidence of infection, please call the office immediately.  FOR ADULT SURGERY PATIENTS: If you need something for pain relief you may take 1 extra strength Tylenol (acetaminophen) AND 2 Ibuprofen (200mg  each) together every 4 hours as needed for pain. (do not take these if you are allergic to them or if you have a reason you should not take them.) Typically, you may only need pain medication for 1 to 3 days.      Due to recent changes in healthcare laws, you may see results of your pathology and/or laboratory studies on MyChart before the  doctors have had a chance to review them. We understand that in some cases there may be results that are confusing or concerning to you. Please understand that not all results are received at the same time and often the doctors may need to interpret multiple results in order to provide you with the best plan of care or course of treatment. Therefore, we ask that you please give [place] 2 business days to thoroughly review all your results before contacting the office for clarification. Should we see a critical lab result, you will be contacted sooner.   If You Need Anything After Your Visit  If you have any questions or concerns for your doctor, please call our main line at [phone] and press option 4 to reach your doctor's medical assistant. If no one answers, please leave a voicemail as directed and we will return your call as soon as possible. Messages left after 4 pm will be answered the following business day.   You may also send [place] a message via MyChart. We typically respond to MyChart messages within 1-2 business days.  For prescription refills, please ask your pharmacy to contact our office. Our fax number is [phone].  If you have an urgent issue when the clinic is closed that cannot wait until the next business day, you can page your doctor at the number below.    Please note that while we do our best to be available for urgent issues outside of office  hours, we are not available 24/7.   If you have an urgent issue and are unable to reach [place], you may choose to seek medical care at your doctor's office, retail clinic, urgent care center, or emergency room.  If you have a medical emergency, please immediately call 911 or go to the emergency department.  Pager Numbers  - Dr. Gwen Pounds: [phone]  - Dr. Roseanne Reno: [phone]  - Dr. Katrinka Blazing: [phone]   In the event of inclement weather, please call our main line at [phone] for an update on the status of any delays or  closures.  Dermatology Medication Tips: Please keep the boxes that topical medications come in in order to help keep track of the instructions about where and how to use these. Pharmacies typically print the medication instructions only on the boxes and not directly on the medication tubes.   If your medication is too expensive, please contact our office at [phone] option 4 or send [place] a message through MyChart.   We are unable to tell what your co-pay for medications will be in advance as this is different depending on your insurance coverage. However, we may be able to find a substitute medication at lower cost or fill out paperwork to get insurance to cover a needed medication.   If a prior authorization is required to get your medication covered by your insurance company, please allow [place] 1-2 business days to complete this process.  Drug prices often vary depending on where the prescription is filled and some pharmacies may offer cheaper prices.  The website www.goodrx.com contains coupons for medications through different pharmacies. The prices here do not account for what the cost may be with help from insurance (it may be cheaper with your insurance), but the website can give you the price if you did not use any insurance.  - You can print the associated coupon and take it with your prescription to the pharmacy.  - You may also stop by our office during regular business hours and pick up a GoodRx coupon card.  - If you need your prescription sent electronically to a different pharmacy, notify our office through [facility] or by phone at [phone] option 4.     Si Usted Necesita Algo Despus de Su Visita  Tambin puede enviarnos un mensaje a travs de Clinical cytogeneticist. Por lo general respondemos a los mensajes de MyChart en el transcurso de 1 a 2 das hbiles.  Para renovar recetas, por favor pida a su farmacia que se ponga en contacto con nuestra oficina. Annie Sable de fax  es [place] [phone].  Si tiene un asunto urgente cuando la clnica est cerrada y que no puede esperar hasta el siguiente da hbil, puede llamar/localizar a su doctor(a) al nmero que aparece a continuacin.   Por favor, tenga en cuenta que aunque hacemos todo lo posible para estar disponibles para asuntos urgentes fuera del horario de [place], no estamos disponibles las 24 horas del da, los 7 [address]  [address] de la [place].   Si tiene un problema urgente y no puede comunicarse con nosotros, puede optar por buscar atencin mdica  en el consultorio de su doctor(a), en una clnica privada, en un centro de atencin urgente o en una sala de emergencias.  Si tiene Engineer, drilling, por favor llame inmediatamente al 911 o vaya a la sala de emergencias.  Nmeros de bper  - Dr. Gwen Pounds: [phone]  - Dra. Roseanne Reno: [phone]  - Dr. Katrinka Blazing: [phone]   En caso de  inclemencias del [place], por favor llame a nuestra lnea principal al [phone] para una actualizacin sobre el [place] de cualquier retraso o cierre.  Consejos para la medicacin en dermatologa: Por favor, guarde las cajas en las que vienen los medicamentos de uso tpico para ayudarle a seguir las instrucciones sobre dnde y cmo usarlos. Las farmacias generalmente imprimen las instrucciones del medicamento slo en las cajas y no directamente en los tubos del [place].   Si su medicamento es muy caro, por favor, pngase en contacto con Rolm Gala llamando al [phone] y presione la opcin 4 o envenos un mensaje a travs de Clinical cytogeneticist.   No podemos decirle cul ser su copago por los medicamentos por adelantado ya que esto es diferente dependiendo de la cobertura de su seguro. Sin embargo, es posible que podamos encontrar un medicamento sustituto a Audiological scientist un formulario para que el seguro cubra el medicamento que se considera necesario.   Si se requiere una autorizacin previa para que su compaa de seguros  [place] su medicamento, por favor permtanos de 1 a 2 das hbiles para completar [address]  Los precios de los medicamentos varan con frecuencia dependiendo del Environmental consultant de dnde se surte la receta y alguna farmacias pueden ofrecer precios ms baratos.  El sitio web www.goodrx.com tiene cupones para medicamentos de Health and safety inspector. Los precios aqu no tienen en cuenta lo que podra costar con la ayuda del seguro (puede ser ms barato con su seguro), pero el sitio web puede darle el precio si no utiliz Tourist information centre manager.  - Puede imprimir el cupn correspondiente y llevarlo con su receta a la farmacia.  - Tambin puede pasar por nuestra oficina durante el horario de atencin regular y Education officer, museum una tarjeta de cupones de GoodRx.  - Si necesita que su receta se enve electrnicamente a una farmacia diferente, informe a nuestra oficina a travs de MyChart de [place] o por telfono llamando al [phone] y presione la opcin 4.

## 2024-07-22 ENCOUNTER — Other Ambulatory Visit: Payer: Self-pay | Admitting: Dermatology

## 2024-07-22 ENCOUNTER — Ambulatory Visit: Payer: Self-pay | Admitting: Dermatology

## 2024-07-22 LAB — ANATOMIC PATHOLOGY REPORT

## 2024-07-23 ENCOUNTER — Encounter: Payer: Self-pay | Admitting: Dermatology

## 2024-07-23 NOTE — Telephone Encounter (Addendum)
 Called and discussed results with patient. She verbalized understanding and denied further questions. 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 SEE COMMENTS.    Mild Dysplastic Recheck next visit ----- Message ----- From: Interface, [facility] Lab Results In Sent: 07/22/2024   2:35 PM EDT To: Alm JAYSON Rhyme, MD

## 2024-10-05 ENCOUNTER — Other Ambulatory Visit: Payer: Self-pay | Admitting: Obstetrics and Gynecology

## 2024-10-05 DIAGNOSIS — Z1231 Encounter for screening mammogram for malignant neoplasm of breast: Secondary | ICD-10-CM

## 2024-10-21 ENCOUNTER — Ambulatory Visit
Admission: RE | Admit: 2024-10-21 | Discharge: 2024-10-21 | Disposition: A | Discharge status: Home / Self Care | Source: Ambulatory Visit | Attending: Obstetrics and Gynecology | Admitting: Obstetrics and Gynecology

## 2024-10-21 DIAGNOSIS — Z1231 Encounter for screening mammogram for malignant neoplasm of breast: Secondary | ICD-10-CM | POA: Diagnosis present

## 2025-07-22 ENCOUNTER — Ambulatory Visit: Admitting: Dermatology
